# Patient Record
Sex: Female | Born: 1995
Health system: Southern US, Community
[De-identification: ages and names within clinical notes are randomized; demographics above are authoritative.]

## PROBLEM LIST (undated history)

## (undated) ENCOUNTER — Inpatient Hospital Stay (HOSPITAL_COMMUNITY): Payer: Self-pay

## (undated) DIAGNOSIS — Z789 Other specified health status: Secondary | ICD-10-CM

## (undated) DIAGNOSIS — F32A Depression, unspecified: Secondary | ICD-10-CM

## (undated) DIAGNOSIS — F419 Anxiety disorder, unspecified: Secondary | ICD-10-CM

## (undated) DIAGNOSIS — J45909 Unspecified asthma, uncomplicated: Secondary | ICD-10-CM

## (undated) DIAGNOSIS — G43909 Migraine, unspecified, not intractable, without status migrainosus: Secondary | ICD-10-CM

## (undated) HISTORY — DX: Depression, unspecified: F32.A

## (undated) HISTORY — DX: Anxiety disorder, unspecified: F41.9

## (undated) HISTORY — PX: DILATION AND CURETTAGE OF UTERUS: SHX78

## (undated) HISTORY — DX: Migraine, unspecified, not intractable, without status migrainosus: G43.909

## (undated) HISTORY — DX: Other specified health status: Z78.9

## (undated) HISTORY — DX: Unspecified asthma, uncomplicated: J45.909

---
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
## 2013-11-01 ENCOUNTER — Encounter (HOSPITAL_COMMUNITY): Payer: Self-pay | Admitting: Emergency Medicine

## 2013-11-01 ENCOUNTER — Emergency Department (HOSPITAL_COMMUNITY)
Admission: EM | Admit: 2013-11-01 | Discharge: 2013-11-01 | Disposition: A | Discharge status: Home / Self Care | Payer: BC Managed Care – PPO | Attending: Emergency Medicine | Admitting: Emergency Medicine

## 2013-11-01 DIAGNOSIS — M545 Low back pain, unspecified: Secondary | ICD-10-CM | POA: Diagnosis present

## 2013-11-01 DIAGNOSIS — Z79899 Other long term (current) drug therapy: Secondary | ICD-10-CM | POA: Insufficient documentation

## 2013-11-01 DIAGNOSIS — N390 Urinary tract infection, site not specified: Secondary | ICD-10-CM | POA: Diagnosis not present

## 2013-11-01 DIAGNOSIS — Z880 Allergy status to penicillin: Secondary | ICD-10-CM | POA: Diagnosis not present

## 2013-11-01 DIAGNOSIS — IMO0002 Reserved for concepts with insufficient information to code with codable children: Secondary | ICD-10-CM | POA: Diagnosis not present

## 2013-11-01 DIAGNOSIS — Z3202 Encounter for pregnancy test, result negative: Secondary | ICD-10-CM | POA: Insufficient documentation

## 2013-11-01 DIAGNOSIS — R6883 Chills (without fever): Secondary | ICD-10-CM | POA: Insufficient documentation

## 2013-11-01 DIAGNOSIS — Z791 Long term (current) use of non-steroidal anti-inflammatories (NSAID): Secondary | ICD-10-CM | POA: Insufficient documentation

## 2013-11-01 DIAGNOSIS — R110 Nausea: Secondary | ICD-10-CM | POA: Insufficient documentation

## 2013-11-01 DIAGNOSIS — R42 Dizziness and giddiness: Secondary | ICD-10-CM | POA: Diagnosis not present

## 2013-11-01 LAB — URINALYSIS W MICROSCOPIC (NOT AT ARMC)
Bilirubin Urine: NEGATIVE
Glucose, UA: NEGATIVE mg/dL
Nitrite: POSITIVE — AB
PROTEIN: 30 mg/dL — AB
Specific Gravity, Urine: 1.015 (ref 1.005–1.030)
UROBILINOGEN UA: 0.2 mg/dL (ref 0.0–1.0)
pH: 7.5 (ref 5.0–8.0)

## 2013-11-01 LAB — PREGNANCY, URINE: PREG TEST UR: NEGATIVE

## 2013-11-01 LAB — CBG MONITORING, ED: Glucose-Capillary: 94 mg/dL (ref 70–99)

## 2013-11-01 MED ORDER — SULFAMETHOXAZOLE-TRIMETHOPRIM 800-160 MG PO TABS: 1.0000 | ORAL_TABLET | Freq: Two times a day (BID) | ORAL | Status: AC

## 2013-11-01 MED ORDER — ONDANSETRON 8 MG PO TBDP
8.0000 mg | ORAL_TABLET | Freq: Once | ORAL | Status: AC
Start: 2013-11-01 — End: 2013-11-01
  Administered 2013-11-01: 8 mg via ORAL
  Filled 2013-11-01: qty 1

## 2013-11-01 NOTE — ED Provider Notes (Signed)
CSN: [id number]     Arrival date & time 11/01/13  1054 History  This chart was scribed for Joya Gaskinsonald W Candler Ginsberg, MD by Leone PayorSonum Patel, ED Scribe. This patient was seen in room APA09/APA09 and the patient's care was started 11:26 AM.    Chief Complaint  Patient presents with  . Dizziness  . Back Pain    Patient is a 18 y.o. female presenting with back pain. The history is provided by the patient. No language interpreter was used.  Back Pain Location:  Lumbar spine Radiates to:  Does not radiate Pain severity:  Mild Pain is:  Same all the time Duration:  4 hours Timing:  Constant Progression:  Worsening Chronicity:  New Context: not lifting heavy objects, not physical stress and not recent injury   Relieved by:  Nothing Worsened by:  Nothing tried Associated symptoms: dysuria   Associated symptoms: no chest pain, no fever, no numbness and no weakness     HPI Comments: Maria Serrano is a 18 y.o. female who presents to the Emergency Department complaining of constant, gradually worsened lower back pain that began this morning. She reports associated chills and dysuria. She also reports nausea and lightheadedness that began after food consumption this morning. Patient denies recent injury or strain to the back. She also complains of brownish colored urine. She denies chest pain, SOB, vaginal bleeding, vaginal discharge.  No syncope reported   PMH - none History reviewed. No pertinent past surgical history. No family history on file. History  Substance Use Topics  . Smoking status: Never Smoker   . Smokeless tobacco: Not on file  . Alcohol Use: No   OB History   Grav Para Term Preterm Abortions TAB SAB Ect Mult Living                 Review of Systems  Constitutional: Positive for chills. Negative for fever.  Respiratory: Negative for shortness of breath.   Cardiovascular: Negative for chest pain.  Gastrointestinal: Positive for nausea.  Genitourinary: Positive for dysuria.  Negative for vaginal bleeding and vaginal discharge.  Musculoskeletal: Positive for back pain.  Neurological: Positive for light-headedness. Negative for weakness and numbness.      Allergies  Amoxicillin  Home Medications   Prior to Admission medications   Medication Sig Start Date End Date Taking? Authorizing Provider  famotidine (PEPCID) 40 MG tablet Take 0.5 tablets (20 mg total) by mouth 2 (two) times daily. 11/24/12   Burgess AmorJulie Idol, PA-C  naproxen sodium (ANAPROX) 220 MG tablet Take 220-440 mg by mouth as needed. For pain     Historical Provider, MD  predniSONE (DELTASONE) 10 MG tablet 6, 5, 4, 3, 2 then 1 tablet by mouth daily for 6 days total. 11/24/12   Burgess AmorJulie Idol, PA-C   BP 113/77  Pulse 114  Temp(Src) 98.9 F (37.2 C) (Oral)  Resp 18  Ht 5\' 3"  (1.6 m)  Wt 159 lb (72.122 kg)  BMI 28.17 kg/m2  SpO2 100%  LMP 10/19/2013 Physical Exam  Nursing note and vitals reviewed.   CONSTITUTIONAL: Well developed/well nourished HEAD: Normocephalic/atraumatic EYES: EOMI/PERRL ENMT: Mucous membranes moist NECK: supple no meningeal signs SPINE: mild lumbar tenderness. No bruising/crepitance/stepoffs noted to spine  CV: S1/S2 noted, no murmurs/rubs/gallops noted LUNGS: Lungs are clear to auscultation bilaterally, no apparent distress ABDOMEN: soft, nontender, no rebound or guarding GU:no cva tenderness NEURO: Pt is awake/alert, moves all extremitiesx4 EXTREMITIES: pulses normal, full ROM SKIN: warm, color normal PSYCH: no abnormalities of mood noted  ED Course  Procedures   DIAGNOSTIC STUDIES: Oxygen Saturation is 100% on RA, normal by my interpretation.    COORDINATION OF CARE: 11:28 AM Discussed treatment plan with pt at bedside and pt agreed to plan.   Labs Review Labs Reviewed  URINALYSIS W MICROSCOPIC - Abnormal; Notable for the following:    Hgb urine dipstick LARGE (*)    Ketones, ur TRACE (*)    Protein, ur 30 (*)    Nitrite POSITIVE (*)    Leukocytes, UA  MODERATE (*)    Bacteria, UA MANY (*)    All other components within normal limits  PREGNANCY, URINE  CBG MONITORING, ED   Pt stable, not septic appearing feels improved, stable for d/c home  MDM   Final diagnoses:  UTI (lower urinary tract infection)    Nursing notes including past medical history and social history reviewed and considered in documentation Labs/vital reviewed and considered   I personally performed the services described in this documentation, which was scribed in my presence. The recorded information has been reviewed and is accurate.      Joya Gaskins, MD 11/01/13 [phone]

## 2013-11-01 NOTE — ED Notes (Signed)
Pt rpeorts pain in lower back, brownish colored urine, feeling shakey, and dizzy since this morning.

## 2015-03-02 ENCOUNTER — Ambulatory Visit (INDEPENDENT_AMBULATORY_CARE_PROVIDER_SITE_OTHER): Payer: Self-pay | Admitting: Otolaryngology

## 2015-12-21 DIAGNOSIS — B86 Scabies: Secondary | ICD-10-CM | POA: Diagnosis not present

## 2016-05-21 DIAGNOSIS — J020 Streptococcal pharyngitis: Secondary | ICD-10-CM | POA: Diagnosis not present

## 2016-07-23 ENCOUNTER — Encounter: Payer: Self-pay | Admitting: Adult Health

## 2016-07-23 ENCOUNTER — Ambulatory Visit (INDEPENDENT_AMBULATORY_CARE_PROVIDER_SITE_OTHER): Payer: BLUE CROSS/BLUE SHIELD | Admitting: Adult Health

## 2016-07-23 VITALS — BP 120/72 | HR 111 | Ht 63.0 in | Wt 186.0 lb

## 2016-07-23 DIAGNOSIS — O3680X0 Pregnancy with inconclusive fetal viability, not applicable or unspecified: Secondary | ICD-10-CM

## 2016-07-23 DIAGNOSIS — Z3490 Encounter for supervision of normal pregnancy, unspecified, unspecified trimester: Secondary | ICD-10-CM

## 2016-07-23 DIAGNOSIS — N912 Amenorrhea, unspecified: Secondary | ICD-10-CM | POA: Diagnosis not present

## 2016-07-23 DIAGNOSIS — Z3201 Encounter for pregnancy test, result positive: Secondary | ICD-10-CM | POA: Diagnosis not present

## 2016-07-23 LAB — POCT URINE PREGNANCY: PREG TEST UR: POSITIVE — AB

## 2016-07-23 MED ORDER — PRENATAL PLUS 27-1 MG PO TABS: 1.0000 | ORAL_TABLET | Freq: Every day | ORAL | 12 refills | Status: DC

## 2016-07-23 NOTE — Progress Notes (Signed)
Subjective:     Patient ID: Maria Serrano, female   DOB: 06/15/1995, 21 y.o.   MRN: [id number]  HPI Luke is a 21 year old white female, married in for UPT, has missed a period and had +HPT, has some nausea.She had been trying for almost 2 years to get pregnant.   Review of Systems +missed period with +HPT +nausea Reviewed past medical,surgical, social and family history. Reviewed medications and allergies.     Objective:   Physical Exam BP 120/72 (BP Location: Right Arm, Patient Position: Sitting, Cuff Size: Normal)   Pulse (!) 111   Ht  (1.6 m)   Wt 186 lb (84.4 kg)   LMP 04/25/2016 (Approximate)   BMI 32.95 kg/m UPT +, about 12+6 weeks by LMP, with EDD 01/29/17. Skin warm and dry. Neck: mid line trachea, normal thyroid, good ROM, no lymphadenopathy noted. Lungs: clear to ausculation bilaterally. Cardiovascular: regular rate and rhythm.Abdomen is soft and non tender   Verification for Medicaid  given.   Assessment:     1. Positive pregnancy test   2. Pregnancy, unspecified gestational age   88. Encounter to determine fetal viability of pregnancy, single or unspecified fetus       Plan:     Meds ordered this encounter  Medications  . prenatal vitamin w/FE, FA (PRENATAL 1 + 1) 27-1 MG TABS tablet    Sig: Take 1 tablet by mouth daily at 12 noon.    Dispense:  30 each    Refill:  12    Order Specific Question:   Supervising Provider    Answer:   Lazaro Arms [[id number]]  Return in 1 day for dating [place] Review handout on first and second trimester    Eat often

## 2016-07-23 NOTE — Patient Instructions (Signed)
Second Trimester of Pregnancy The second trimester is from week 14 through week 27 (months 4 through 6). The second trimester is often a time when you feel your best. Your body has adjusted to being pregnant, and you begin to feel better physically. Usually, morning sickness has lessened or quit completely, you may have more energy, and you may have an increase in appetite. The second trimester is also a time when the fetus is growing rapidly. At the end of the sixth month, the fetus is about 9 inches long and weighs about 1 pounds. You will likely begin to feel the baby move (quickening) between 16 and 20 weeks of pregnancy. Body changes during your second trimester Your body continues to go through many changes during your second trimester. The changes vary from woman to woman.  Your weight will continue to increase. You will notice your lower abdomen bulging out.  You may begin to get stretch marks on your hips, abdomen, and breasts.  You may develop headaches that can be relieved by medicines. The medicines should be approved by your health care provider.  You may urinate more often because the fetus is pressing on your bladder.  You may develop or continue to have heartburn as a result of your pregnancy.  You may develop constipation because certain hormones are causing the muscles that push waste through your intestines to slow down.  You may develop hemorrhoids or swollen, bulging veins (varicose veins).  You may have back pain. This is caused by:  Weight gain.  Pregnancy hormones that are relaxing the joints in your pelvis.  A shift in weight and the muscles that support your balance.  Your breasts will continue to grow and they will continue to become tender.  Your gums may bleed and may be sensitive to brushing and flossing.  Dark spots or blotches (chloasma, mask of pregnancy) may develop on your face. This will likely fade after the baby is born.  A dark line from your  belly button to the pubic area (linea nigra) may appear. This will likely fade after the baby is born.  You may have changes in your hair. These can include thickening of your hair, rapid growth, and changes in texture. Some women also have hair loss during or after pregnancy, or hair that feels dry or thin. Your hair will most likely return to normal after your baby is born. What to expect at prenatal visits During a routine prenatal visit:  You will be weighed to make sure you and the fetus are growing normally.  Your blood pressure will be taken.  Your abdomen will be measured to track your baby's growth.  The fetal heartbeat will be listened to.  Any test results from the previous visit will be discussed. Your health care provider may ask you:  How you are feeling.  If you are feeling the baby move.  If you have had any abnormal symptoms, such as leaking fluid, bleeding, severe headaches, or abdominal cramping.  If you are using any tobacco products, including cigarettes, chewing tobacco, and electronic cigarettes.  If you have any questions. Other tests that may be performed during your second trimester include:  Blood tests that check for:  Low iron levels (anemia).  High blood sugar that affects pregnant women (gestational diabetes) between 24 and 28 weeks.  Rh antibodies. This is to check for a protein on red blood cells (Rh factor).  Urine tests to check for infections, diabetes, or protein in the   urine.  An ultrasound to confirm the proper growth and development of the baby.  An amniocentesis to check for possible genetic problems.  Fetal screens for spina bifida and Down syndrome.  HIV (human immunodeficiency virus) testing. Routine prenatal testing includes screening for HIV, unless you choose not to have this test. Follow these instructions at home: Medicines   Follow your health care provider's instructions regarding medicine use. Specific medicines may  be either safe or unsafe to take during pregnancy.  Take a prenatal vitamin that contains at least 600 micrograms (mcg) of folic acid.  If you develop constipation, try taking a stool softener if your health care provider approves. Eating and drinking   Eat a balanced diet that includes fresh fruits and vegetables, whole grains, good sources of protein such as meat, eggs, or tofu, and low-fat dairy. Your health care provider will help you determine the amount of weight gain that is right for you.  Avoid raw meat and uncooked cheese. These carry germs that can cause birth defects in the baby.  If you have low calcium intake from food, talk to your health care provider about whether you should take a daily calcium supplement.  Limit foods that are high in fat and processed sugars, such as fried and sweet foods.  To prevent constipation:  Drink enough fluid to keep your urine clear or pale yellow.  Eat foods that are high in fiber, such as fresh fruits and vegetables, whole grains, and beans. Activity   Exercise only as directed by your health care provider. Most women can continue their usual exercise routine during pregnancy. Try to exercise for 30 minutes at least 5 days a week. Stop exercising if you experience uterine contractions.  Avoid heavy lifting, wear low heel shoes, and practice good posture.  A sexual relationship may be continued unless your health care provider directs you otherwise. Relieving pain and discomfort   Wear a good support bra to prevent discomfort from breast tenderness.  Take warm sitz baths to soothe any pain or discomfort caused by hemorrhoids. Use hemorrhoid cream if your health care provider approves.  Rest with your legs elevated if you have leg cramps or low back pain.  If you develop varicose veins, wear support hose. Elevate your feet for 15 minutes, 3-4 times a day. Limit salt in your diet. Prenatal Care   Write down your questions. Take them  to your prenatal visits.  Keep all your prenatal visits as told by your health care provider. This is important. Safety   Wear your seat belt at all times when driving.  Make a list of emergency phone numbers, including numbers for family, friends, the hospital, and police and fire departments. General instructions   Ask your health care provider for a referral to a local prenatal education class. Begin classes no later than the beginning of month 6 of your pregnancy.  Ask for help if you have counseling or nutritional needs during pregnancy. Your health care provider can offer advice or refer you to specialists for help with various needs.  Do not use hot tubs, steam rooms, or saunas.  Do not douche or use tampons or scented sanitary pads.  Do not cross your legs for long periods of time.  Avoid cat litter boxes and soil used by cats. These carry germs that can cause birth defects in the baby and possibly loss of the fetus by miscarriage or stillbirth.  Avoid all smoking, herbs, alcohol, and unprescribed drugs. Chemicals in   these products can affect the formation and growth of the baby.  Do not use any products that contain nicotine or tobacco, such as cigarettes and e-cigarettes. If you need help quitting, ask your health care provider.  Visit your dentist if you have not gone yet during your pregnancy. Use a soft toothbrush to brush your teeth and be gentle when you floss. Contact a health care provider if:  You have dizziness.  You have mild pelvic cramps, pelvic pressure, or nagging pain in the abdominal area.  You have persistent nausea, vomiting, or diarrhea.  You have a bad smelling vaginal discharge.  You have pain when you urinate. Get help right away if:  You have a fever.  You are leaking fluid from your vagina.  You have spotting or bleeding from your vagina.  You have severe abdominal cramping or pain.  You have rapid weight gain or weight loss.  You  have shortness of breath with chest pain.  You notice sudden or extreme swelling of your face, hands, ankles, feet, or legs.  You have not felt your baby move in over an hour.  You have severe headaches that do not go away when you take medicine.  You have vision changes. Summary  The second trimester is from week 14 through week 27 (months 4 through 6). It is also a time when the fetus is growing rapidly.  Your body goes through many changes during pregnancy. The changes vary from woman to woman.  Avoid all smoking, herbs, alcohol, and unprescribed drugs. These chemicals affect the formation and growth your baby.  Do not use any tobacco products, such as cigarettes, chewing tobacco, and e-cigarettes. If you need help quitting, ask your health care provider.  Contact your health care provider if you have any questions. Keep all prenatal visits as told by your health care provider. This is important. This information is not intended to replace advice given to you by your health care provider. Make sure you discuss any questions you have with your health care provider. Document Released: 03/05/2001 Document Revised: 08/17/2015 Document Reviewed: 05/12/2012 Elsevier Interactive Patient Education  2017 [employer]. First Trimester of Pregnancy The first trimester of pregnancy is from week 1 until the end of week 13 (months 1 through 3). A week after a sperm fertilizes an egg, the egg will implant on the wall of the uterus. This embryo will begin to develop into a baby. Genes from you and your partner will form the baby. The female genes will determine whether the baby will be a boy or a girl. At 6-8 weeks, the eyes and face will be formed, and the heartbeat can be seen on ultrasound. At the end of 12 weeks, all the baby's organs will be formed. Now that you are pregnant, you will want to do everything you can to have a healthy baby. Two of the most important things are to get good prenatal care  and to follow your health care provider's instructions. Prenatal care is all the medical care you receive before the baby's birth. This care will help prevent, find, and treat any problems during the pregnancy and childbirth. Body changes during your first trimester Your body goes through many changes during pregnancy. The changes vary from woman to woman.  You may gain or lose a couple of pounds at first.  You may feel sick to your stomach (nauseous) and you may throw up (vomit). If the vomiting is uncontrollable, call your health care provider.  You may tire easily.  You may develop headaches that can be relieved by medicines. All medicines should be approved by your health care provider.  You may urinate more often. Painful urination may mean you have a bladder infection.  You may develop heartburn as a result of your pregnancy.  You may develop constipation because certain hormones are causing the muscles that push stool through your intestines to slow down.  You may develop hemorrhoids or swollen veins (varicose veins).  Your breasts may begin to grow larger and become tender. Your nipples may stick out more, and the tissue that surrounds them (areola) may become darker.  Your gums may bleed and may be sensitive to brushing and flossing.  Dark spots or blotches (chloasma, mask of pregnancy) may develop on your face. This will likely fade after the baby is born.  Your menstrual periods will stop.  You may have a loss of appetite.  You may develop cravings for certain kinds of food.  You may have changes in your emotions from day to day, such as being excited to be pregnant or being concerned that something may go wrong with the pregnancy and baby.  You may have more vivid and strange dreams.  You may have changes in your hair. These can include thickening of your hair, rapid growth, and changes in texture. Some women also have hair loss during or after pregnancy, or hair that  feels dry or thin. Your hair will most likely return to normal after your baby is born. What to expect at prenatal visits During a routine prenatal visit:  You will be weighed to make sure you and the baby are growing normally.  Your blood pressure will be taken.  Your abdomen will be measured to track your baby's growth.  The fetal heartbeat will be listened to between weeks 10 and 14 of your pregnancy.  Test results from any previous visits will be discussed. Your health care provider may ask you:  How you are feeling.  If you are feeling the baby move.  If you have had any abnormal symptoms, such as leaking fluid, bleeding, severe headaches, or abdominal cramping.  If you are using any tobacco products, including cigarettes, chewing tobacco, and electronic cigarettes.  If you have any questions. Other tests that may be performed during your first trimester include:  Blood tests to find your blood type and to check for the presence of any previous infections. The tests will also be used to check for low iron levels (anemia) and protein on red blood cells (Rh antibodies). Depending on your risk factors, or if you previously had diabetes during pregnancy, you may have tests to check for high blood sugar that affects pregnant women (gestational diabetes).  Urine tests to check for infections, diabetes, or protein in the urine.  An ultrasound to confirm the proper growth and development of the baby.  Fetal screens for spinal cord problems (spina bifida) and Down syndrome.  HIV (human immunodeficiency virus) testing. Routine prenatal testing includes screening for HIV, unless you choose not to have this test.  You may need other tests to make sure you and the baby are doing well. Follow these instructions at home: Medicines   Follow your health care provider's instructions regarding medicine use. Specific medicines may be either safe or unsafe to take during pregnancy.  Take a  prenatal vitamin that contains at least 600 micrograms (mcg) of folic acid.  If you develop constipation, try taking a stool softener  if your health care provider approves. Eating and drinking   Eat a balanced diet that includes fresh fruits and vegetables, whole grains, good sources of protein such as meat, eggs, or tofu, and low-fat dairy. Your health care provider will help you determine the amount of weight gain that is right for you.  Avoid raw meat and uncooked cheese. These carry germs that can cause birth defects in the baby.  Eating four or five small meals rather than three large meals a day may help relieve nausea and vomiting. If you start to feel nauseous, eating a few soda crackers can be helpful. Drinking liquids between meals, instead of during meals, also seems to help ease nausea and vomiting.  Limit foods that are high in fat and processed sugars, such as fried and sweet foods.  To prevent constipation:  Eat foods that are high in fiber, such as fresh fruits and vegetables, whole grains, and beans.  Drink enough fluid to keep your urine clear or pale yellow. Activity   Exercise only as directed by your health care provider. Most women can continue their usual exercise routine during pregnancy. Try to exercise for 30 minutes at least 5 days a week. Exercising will help you:  Control your weight.  Stay in shape.  Be prepared for labor and delivery.  Experiencing pain or cramping in the lower abdomen or lower back is a good sign that you should stop exercising. Check with your health care provider before continuing with normal exercises.  Try to avoid standing for long periods of time. Move your legs often if you must stand in one place for a long time.  Avoid heavy lifting.  Wear low-heeled shoes and practice good posture.  You may continue to have sex unless your health care provider tells you not to. Relieving pain and discomfort   Wear a good support bra to  relieve breast tenderness.  Take warm sitz baths to soothe any pain or discomfort caused by hemorrhoids. Use hemorrhoid cream if your health care provider approves.  Rest with your legs elevated if you have leg cramps or low back pain.  If you develop varicose veins in your legs, wear support hose. Elevate your feet for 15 minutes, 3-4 times a day. Limit salt in your diet. Prenatal care   Schedule your prenatal visits by the twelfth week of pregnancy. They are usually scheduled monthly at first, then more often in the last 2 months before delivery.  Write down your questions. Take them to your prenatal visits.  Keep all your prenatal visits as told by your health care provider. This is important. Safety   Wear your seat belt at all times when driving.  Make a list of emergency phone numbers, including numbers for family, friends, the hospital, and police and fire departments. General instructions   Ask your health care provider for a referral to a local prenatal education class. Begin classes no later than the beginning of month 6 of your pregnancy.  Ask for help if you have counseling or nutritional needs during pregnancy. Your health care provider can offer advice or refer you to specialists for help with various needs.  Do not use hot tubs, steam rooms, or saunas.  Do not douche or use tampons or scented sanitary pads.  Do not cross your legs for long periods of time.  Avoid cat litter boxes and soil used by cats. These carry germs that can cause birth defects in the baby and possibly loss of  the fetus by miscarriage or stillbirth.  Avoid all smoking, herbs, alcohol, and medicines not prescribed by your health care provider. Chemicals in these products affect the formation and growth of the baby.  Do not use any products that contain nicotine or tobacco, such as cigarettes and e-cigarettes. If you need help quitting, ask your health care provider. You may receive counseling  support and other resources to help you quit.  Schedule a dentist appointment. At home, brush your teeth with a soft toothbrush and be gentle when you floss. Contact a health care provider if:  You have dizziness.  You have mild pelvic cramps, pelvic pressure, or nagging pain in the abdominal area.  You have persistent nausea, vomiting, or diarrhea.  You have a bad smelling vaginal discharge.  You have pain when you urinate.  You notice increased swelling in your face, hands, legs, or ankles.  You are exposed to fifth disease or chickenpox.  You are exposed to [place] measles (rubella) and have never had it. Get help right away if:  You have a fever.  You are leaking fluid from your vagina.  You have spotting or bleeding from your vagina.  You have severe abdominal cramping or pain.  You have rapid weight gain or loss.  You vomit blood or material that looks like coffee grounds.  You develop a severe headache.  You have shortness of breath.  You have any kind of trauma, such as from a fall or a car accident. Summary  The first trimester of pregnancy is from week 1 until the end of week 13 (months 1 through 3).  Your body goes through many changes during pregnancy. The changes vary from woman to woman.  You will have routine prenatal visits. During those visits, your health care provider will examine you, discuss any test results you may have, and talk with you about how you are feeling. This information is not intended to replace advice given to you by your health care provider. Make sure you discuss any questions you have with your health care provider. Document Released: 03/05/2001 Document Revised: 02/21/2016 Document Reviewed: 02/21/2016 Elsevier Interactive Patient Education  2017 [employer].

## 2016-07-24 ENCOUNTER — Ambulatory Visit (INDEPENDENT_AMBULATORY_CARE_PROVIDER_SITE_OTHER): Payer: BLUE CROSS/BLUE SHIELD

## 2016-07-24 DIAGNOSIS — O3680X0 Pregnancy with inconclusive fetal viability, not applicable or unspecified: Secondary | ICD-10-CM

## 2016-07-24 NOTE — Progress Notes (Signed)
[place] 6+2 wks GS w/ys,no fetal pole seen,subchorionic hemorrhage 1.5 x 1.8 x 1.2 cm,normal ovaries bilat,pt will come back for a f/u ultrasound in 10 days per Victorino Dike

## 2016-08-02 ENCOUNTER — Other Ambulatory Visit: Payer: Self-pay | Admitting: Adult Health

## 2016-08-02 DIAGNOSIS — O3680X0 Pregnancy with inconclusive fetal viability, not applicable or unspecified: Secondary | ICD-10-CM

## 2016-08-05 ENCOUNTER — Ambulatory Visit (INDEPENDENT_AMBULATORY_CARE_PROVIDER_SITE_OTHER): Payer: BLUE CROSS/BLUE SHIELD

## 2016-08-05 DIAGNOSIS — O3680X0 Pregnancy with inconclusive fetal viability, not applicable or unspecified: Secondary | ICD-10-CM

## 2016-08-05 NOTE — Progress Notes (Signed)
US 6+5 wks,single IUP w/ys,pos fht 127 bpm,normal ovaries bilat,crl 8.35 mm,EDD 03/26/2017

## 2016-08-22 ENCOUNTER — Encounter: Payer: BLUE CROSS/BLUE SHIELD | Admitting: Women's Health

## 2016-08-22 ENCOUNTER — Ambulatory Visit: Payer: BLUE CROSS/BLUE SHIELD | Admitting: *Deleted

## 2016-08-22 ENCOUNTER — Ambulatory Visit (INDEPENDENT_AMBULATORY_CARE_PROVIDER_SITE_OTHER): Payer: BLUE CROSS/BLUE SHIELD | Admitting: Women's Health

## 2016-08-22 ENCOUNTER — Encounter: Payer: Self-pay | Admitting: Women's Health

## 2016-08-22 ENCOUNTER — Other Ambulatory Visit (HOSPITAL_COMMUNITY)
Admission: RE | Admit: 2016-08-22 | Discharge: 2016-08-22 | Disposition: A | Discharge status: Home / Self Care | Payer: BLUE CROSS/BLUE SHIELD | Source: Ambulatory Visit | Attending: Obstetrics & Gynecology | Admitting: Obstetrics & Gynecology

## 2016-08-22 VITALS — BP 100/64 | HR 108 | Wt 182.0 lb

## 2016-08-22 DIAGNOSIS — Z3A09 9 weeks gestation of pregnancy: Secondary | ICD-10-CM | POA: Diagnosis not present

## 2016-08-22 DIAGNOSIS — Z1389 Encounter for screening for other disorder: Secondary | ICD-10-CM

## 2016-08-22 DIAGNOSIS — Z3401 Encounter for supervision of normal first pregnancy, first trimester: Secondary | ICD-10-CM | POA: Insufficient documentation

## 2016-08-22 DIAGNOSIS — Z124 Encounter for screening for malignant neoplasm of cervix: Secondary | ICD-10-CM

## 2016-08-22 DIAGNOSIS — Z3400 Encounter for supervision of normal first pregnancy, unspecified trimester: Secondary | ICD-10-CM | POA: Insufficient documentation

## 2016-08-22 DIAGNOSIS — Z331 Pregnant state, incidental: Secondary | ICD-10-CM

## 2016-08-22 LAB — POCT URINALYSIS DIPSTICK
GLUCOSE UA: NEGATIVE
KETONES UA: NEGATIVE
Leukocytes, UA: NEGATIVE
Nitrite, UA: NEGATIVE
Protein, UA: NEGATIVE
RBC UA: NEGATIVE

## 2016-08-22 NOTE — Progress Notes (Signed)
  Subjective:  Maria HickmanBrittany N Shackett is a 21 y.o. G1P0 Caucasian female at 236w1d by 6wk u/s, being seen today for her first obstetrical visit.  Her obstetrical history is significant for primigravida.  Pregnancy history fully reviewed.  Patient reports n/v- declines meds at this time. Denies vb, cramping, uti s/s, abnormal/malodorous vag d/c, or vulvovaginal itching/irritation.  BP 100/64   Pulse (!) 108   Wt 182 lb (82.6 kg)   LMP 04/25/2016 (Approximate)   BMI 32.24 kg/m   HISTORY: OB History  Gravida Para Term Preterm AB Living  1            SAB TAB Ectopic Multiple Live Births               # Outcome Date GA Lbr Len/2nd Weight Sex Delivery Anes PTL Lv  1 Current              Past Medical History:  Diagnosis Date  . Medical history non-contributory    Past Surgical History:  Procedure Laterality Date  . NO PAST SURGERIES     Family History  Problem Relation Age of Onset  . COPD Maternal Grandfather   . Cancer Maternal Grandfather        lung  . Hypertension Father   . Endometriosis Maternal Aunt     Exam   System:     General: Well developed & nourished, no acute distress   Skin: Warm & dry, normal coloration and turgor, no rashes   Neurologic: Alert & oriented, normal mood   Cardiovascular: Regular rate & rhythm   Respiratory: Effort & rate normal, LCTAB, acyanotic   Abdomen: Soft, non tender   Extremities: normal strength, tone   Pelvic Exam:    Perineum: Normal perineum   Vulva: Normal, no lesions   Vagina:  Normal mucosa, normal discharge   Cervix: Normal, bulbous, appears closed   Uterus: Normal size/shape/contour for GA   Thin prep pap smear obtained w/ reflex high risk HPV cotesting FHR: + via informal transabdominal u/s   Assessment:   Pregnancy: G1P0 Patient Active Problem List   Diagnosis Date Noted  . Supervision of normal first pregnancy 08/22/2016    576w1d G1P0 New OB visit N/V of pregnancy  Plan:  Initial labs obtained Continue  prenatal vitamins Problem list reviewed and updated Reviewed n/v relief measures and warning s/s to report Reviewed recommended weight gain based on pre-gravid BMI Encouraged well-balanced diet Genetic Screening discussed Integrated Screen: declined today, wants to discuss w/ partner, will call back if she changes her mind Cystic fibrosis screening discussed declined Ultrasound discussed; fetal survey: requested Follow up in 4 weeks for visit [facility] completed NFPartnership offered, accepted, referral faxed   Marge DuncansBooker, Demba Nigh Randall CNM, [facility] InstituteWHNP-BC 08/22/2016 4:26 PM

## 2016-08-22 NOTE — Patient Instructions (Signed)

## 2016-08-23 ENCOUNTER — Encounter: Payer: Self-pay | Admitting: Women's Health

## 2016-08-24 LAB — URINALYSIS, ROUTINE W REFLEX MICROSCOPIC
BILIRUBIN UA: NEGATIVE
Glucose, UA: NEGATIVE
Ketones, UA: NEGATIVE
LEUKOCYTES UA: NEGATIVE
Nitrite, UA: NEGATIVE
PH UA: 7.5 (ref 5.0–7.5)
PROTEIN UA: NEGATIVE
RBC UA: NEGATIVE
Specific Gravity, UA: <=1.005 — AB (ref 1.005–1.030)
Urobilinogen, Ur: 0.2 mg/dL (ref 0.2–1.0)

## 2016-08-24 LAB — PMP SCREEN PROFILE (10S), URINE
AMPHETAMINE SCREEN URINE: NEGATIVE ng/mL
BARBITURATE SCREEN URINE: NEGATIVE ng/mL
BENZODIAZEPINE SCREEN, URINE: NEGATIVE ng/mL
CANNABINOIDS UR QL SCN: NEGATIVE ng/mL
CREATININE(CRT), U: 18.2 mg/dL — AB (ref 20.0–300.0)
Cocaine (Metab) Scrn, Ur: NEGATIVE ng/mL
METHADONE SCREEN, URINE: NEGATIVE ng/mL
OPIATE SCREEN URINE: NEGATIVE ng/mL
OXYCODONE+OXYMORPHONE UR QL SCN: NEGATIVE ng/mL
PHENCYCLIDINE QUANTITATIVE URINE: NEGATIVE ng/mL
PROPOXYPHENE SCREEN URINE: NEGATIVE ng/mL
Ph of Urine: 7.1 (ref 4.5–8.9)

## 2016-08-24 LAB — URINE CULTURE

## 2016-08-24 LAB — CBC
HEMOGLOBIN: 15.1 g/dL (ref 11.1–15.9)
Hematocrit: 43.2 % (ref 34.0–46.6)
MCH: 33.6 pg — ABNORMAL HIGH (ref 26.6–33.0)
MCHC: 35 g/dL (ref 31.5–35.7)
MCV: 96 fL (ref 79–97)
Platelets: 287 10*3/uL (ref 150–379)
RBC: 4.49 x10E6/uL (ref 3.77–5.28)
RDW: 12 % — ABNORMAL LOW (ref 12.3–15.4)
WBC: 9.6 10*3/uL (ref 3.4–10.8)

## 2016-08-24 LAB — ABO/RH: RH TYPE: POSITIVE

## 2016-08-24 LAB — RUBELLA SCREEN: Rubella Antibodies, IGG: 1.08 index (ref >0.99)

## 2016-08-24 LAB — HEPATITIS B SURFACE ANTIGEN: HEP B S AG: NEGATIVE

## 2016-08-24 LAB — RPR: RPR Ser Ql: NONREACTIVE

## 2016-08-24 LAB — VARICELLA ZOSTER ANTIBODY, IGG: Varicella zoster IgG: 199 index (ref >165)

## 2016-08-24 LAB — ANTIBODY SCREEN: ANTIBODY SCREEN: NEGATIVE

## 2016-08-24 LAB — HIV ANTIBODY (ROUTINE TESTING W REFLEX): HIV SCREEN 4TH GENERATION: NONREACTIVE

## 2016-08-24 LAB — SPECIFIC GRAVITY: SPECIFIC GRAVITY: 1.0028

## 2016-08-29 LAB — CYTOLOGY - PAP
Chlamydia: NEGATIVE
DIAGNOSIS: NEGATIVE
NEISSERIA GONORRHEA: NEGATIVE

## 2016-09-18 ENCOUNTER — Other Ambulatory Visit: Payer: Self-pay | Admitting: Women's Health

## 2016-09-18 DIAGNOSIS — Z3682 Encounter for antenatal screening for nuchal translucency: Secondary | ICD-10-CM

## 2016-09-19 ENCOUNTER — Ambulatory Visit (INDEPENDENT_AMBULATORY_CARE_PROVIDER_SITE_OTHER): Payer: BLUE CROSS/BLUE SHIELD | Admitting: Obstetrics & Gynecology

## 2016-09-19 ENCOUNTER — Ambulatory Visit (INDEPENDENT_AMBULATORY_CARE_PROVIDER_SITE_OTHER): Payer: BLUE CROSS/BLUE SHIELD

## 2016-09-19 ENCOUNTER — Encounter: Payer: Self-pay | Admitting: Obstetrics & Gynecology

## 2016-09-19 VITALS — BP 108/62 | HR 72 | Wt 182.0 lb

## 2016-09-19 DIAGNOSIS — Z3682 Encounter for antenatal screening for nuchal translucency: Secondary | ICD-10-CM | POA: Diagnosis not present

## 2016-09-19 DIAGNOSIS — Z1379 Encounter for other screening for genetic and chromosomal anomalies: Secondary | ICD-10-CM

## 2016-09-19 DIAGNOSIS — Z3401 Encounter for supervision of normal first pregnancy, first trimester: Secondary | ICD-10-CM

## 2016-09-19 DIAGNOSIS — Z3402 Encounter for supervision of normal first pregnancy, second trimester: Secondary | ICD-10-CM

## 2016-09-19 DIAGNOSIS — Z1389 Encounter for screening for other disorder: Secondary | ICD-10-CM

## 2016-09-19 DIAGNOSIS — Z331 Pregnant state, incidental: Secondary | ICD-10-CM

## 2016-09-19 LAB — POCT URINALYSIS DIPSTICK
Glucose, UA: NEGATIVE
Ketones, UA: NEGATIVE
Leukocytes, UA: NEGATIVE
Nitrite, UA: NEGATIVE
PROTEIN UA: NEGATIVE
RBC UA: NEGATIVE

## 2016-09-19 NOTE — Progress Notes (Signed)
G1P0 9463w1d Estimated Date of Delivery: 03/26/17  Blood pressure 108/62, pulse 72, weight 182 lb (82.6 kg), last menstrual period 04/25/2016.   BP weight and urine results all reviewed and noted.  Please refer to the obstetrical flow sheet for the fundal height and fetal heart rate documentation:  Patient reports good fetal movement, denies any bleeding and no rupture of membranes symptoms or regular contractions. Patient is without complaints. All questions were answered.  Orders Placed This Encounter  Procedures  . Maternal Screen, Integrated #1  . POCT urinalysis dipstick    Plan:  Continued routine obstetrical care, NT sonogram is normal  Return in about 4 weeks (around 10/17/2016) for 2nd IT, LROB.

## 2016-09-19 NOTE — Progress Notes (Signed)
US 13+1 wks,NB present,NT 1.4 mm,normal ovaries bilat,crl 73.1 mm,post pl gr 0

## 2016-09-22 LAB — MATERNAL SCREEN, INTEGRATED #1
Crown Rump Length: 73.1 mm
Gest. Age on Collection Date: 13.1 weeks
Maternal Age at EDD: 23.6 yr
Nuchal Translucency (NT): 1.4 mm
Number of Fetuses: 1
PAPP-A Value: 274 ng/mL
Weight: 182 [lb_av]

## 2016-10-17 ENCOUNTER — Ambulatory Visit (INDEPENDENT_AMBULATORY_CARE_PROVIDER_SITE_OTHER): Payer: BLUE CROSS/BLUE SHIELD | Admitting: Advanced Practice Midwife

## 2016-10-17 ENCOUNTER — Encounter: Payer: Self-pay | Admitting: Advanced Practice Midwife

## 2016-10-17 VITALS — BP 104/62 | HR 96 | Wt 182.0 lb

## 2016-10-17 DIAGNOSIS — Z3A17 17 weeks gestation of pregnancy: Secondary | ICD-10-CM

## 2016-10-17 DIAGNOSIS — Z331 Pregnant state, incidental: Secondary | ICD-10-CM

## 2016-10-17 DIAGNOSIS — Z1379 Encounter for other screening for genetic and chromosomal anomalies: Secondary | ICD-10-CM | POA: Diagnosis not present

## 2016-10-17 DIAGNOSIS — Z1389 Encounter for screening for other disorder: Secondary | ICD-10-CM

## 2016-10-17 DIAGNOSIS — Z3402 Encounter for supervision of normal first pregnancy, second trimester: Secondary | ICD-10-CM

## 2016-10-17 DIAGNOSIS — Z363 Encounter for antenatal screening for malformations: Secondary | ICD-10-CM

## 2016-10-17 LAB — POCT URINALYSIS DIPSTICK
Glucose, UA: NEGATIVE
KETONES UA: NEGATIVE
Leukocytes, UA: NEGATIVE
Nitrite, UA: NEGATIVE
PROTEIN UA: NEGATIVE
RBC UA: NEGATIVE

## 2016-10-17 NOTE — Progress Notes (Signed)
G1P0 6940w1d Estimated Date of Delivery: 03/26/17  Last menstrual period 04/25/2016.   BP weight and urine results all reviewed and noted.  Please refer to the obstetrical flow sheet for the fundal height and fetal heart rate documentation:  Patient denies any bleeding and no rupture of membranes symptoms or regular contractions. Patient is without complaints. Is eating more healthy All questions were answered.  No orders of the defined types were placed in this encounter.   Plan:  Continued routine obstetrical care, 2ND it TODAY   Return in about 2 weeks (around 10/31/2016) for LROB, ZO:[id number]:Anatomy. `

## 2016-10-22 ENCOUNTER — Encounter: Payer: Self-pay | Admitting: Advanced Practice Midwife

## 2016-10-22 ENCOUNTER — Other Ambulatory Visit: Payer: BLUE CROSS/BLUE SHIELD

## 2016-10-22 ENCOUNTER — Other Ambulatory Visit: Payer: Self-pay | Admitting: Advanced Practice Midwife

## 2016-10-22 DIAGNOSIS — O289 Unspecified abnormal findings on antenatal screening of mother: Secondary | ICD-10-CM

## 2016-10-22 DIAGNOSIS — Z363 Encounter for antenatal screening for malformations: Secondary | ICD-10-CM

## 2016-10-22 DIAGNOSIS — O285 Abnormal chromosomal and genetic finding on antenatal screening of mother: Secondary | ICD-10-CM

## 2016-10-22 DIAGNOSIS — O351XX0 Maternal care for (suspected) chromosomal abnormality in fetus, not applicable or unspecified: Secondary | ICD-10-CM | POA: Diagnosis not present

## 2016-10-22 LAB — MATERNAL SCREEN, INTEGRATED #2
ADSF: 0.48
AFP MOM: 0.86
Alpha-Fetoprotein: 27 ng/mL
Crown Rump Length: 73.1 mm
DIA MoM: 0.85
DIA Value: 125.2 pg/mL
Estriol, Unconjugated: 0.45 ng/mL
GEST. AGE ON COLLECTION DATE: 13.1 wk
GESTATIONAL AGE: 17.1 wk
Maternal Age at EDD: 23.6 yr
NUMBER OF FETUSES: 1
Nuchal Translucency (NT): 1.4 mm
Nuchal Translucency MoM: 0.78
PAPP-A MoM: 0.29
PAPP-A VALUE: 274 ng/mL
TEST RESULTS: POSITIVE — AB
WEIGHT: 182 [lb_av]
Weight: 182 [lb_av]
hCG MoM: 0.33
hCG Value: 7.9 IU/mL

## 2016-10-22 NOTE — Progress Notes (Signed)
Elevated T18 risk.  MFM referral for 8/2 at 12;45  for genetic counseling and US.  cfDNA offered and accepted.  Will come today for informasique.  (need to have accurate weight when ordering).

## 2016-10-24 ENCOUNTER — Ambulatory Visit (HOSPITAL_COMMUNITY)
Admission: RE | Admit: 2016-10-24 | Discharge: 2016-10-24 | Disposition: A | Discharge status: Home / Self Care | Payer: BLUE CROSS/BLUE SHIELD | Source: Ambulatory Visit | Attending: Advanced Practice Midwife | Admitting: Advanced Practice Midwife

## 2016-10-24 ENCOUNTER — Encounter (HOSPITAL_COMMUNITY): Payer: Self-pay

## 2016-10-24 ENCOUNTER — Telehealth (HOSPITAL_COMMUNITY): Payer: Self-pay | Admitting: MS"

## 2016-10-24 DIAGNOSIS — O280 Abnormal hematological finding on antenatal screening of mother: Secondary | ICD-10-CM | POA: Diagnosis not present

## 2016-10-24 DIAGNOSIS — O99212 Obesity complicating pregnancy, second trimester: Secondary | ICD-10-CM | POA: Insufficient documentation

## 2016-10-24 DIAGNOSIS — Z363 Encounter for antenatal screening for malformations: Secondary | ICD-10-CM | POA: Diagnosis not present

## 2016-10-24 DIAGNOSIS — Z3A18 18 weeks gestation of pregnancy: Secondary | ICD-10-CM | POA: Insufficient documentation

## 2016-10-24 DIAGNOSIS — O283 Abnormal ultrasonic finding on antenatal screening of mother: Secondary | ICD-10-CM | POA: Diagnosis not present

## 2016-10-24 DIAGNOSIS — Z6831 Body mass index (BMI) 31.0-31.9, adult: Secondary | ICD-10-CM | POA: Diagnosis not present

## 2016-10-24 DIAGNOSIS — O281 Abnormal biochemical finding on antenatal screening of mother: Secondary | ICD-10-CM | POA: Diagnosis not present

## 2016-10-24 DIAGNOSIS — E669 Obesity, unspecified: Secondary | ICD-10-CM | POA: Insufficient documentation

## 2016-10-24 DIAGNOSIS — Z3402 Encounter for supervision of normal first pregnancy, second trimester: Secondary | ICD-10-CM

## 2016-10-24 NOTE — Telephone Encounter (Signed)
Left message for patient. Pregnancy options consultation scheduled with Dr. Bobbe MedicoApril Miller at [facility] 10/30/16 at 10:00 AM at [address] [place]. Left phone number for patient to contact with questions.   Clydie BraunKaren Briony Parveen 10/24/2016 3:47 PM

## 2016-10-24 NOTE — Progress Notes (Signed)
Genetic Counseling  High-Risk Gestation Note  Appointment Date:  10/24/2016 Referred By: Wandra Mannanresenzo-Dishmon, Franc* Date of Birth:  12-23-1995 Partner:  Gwynneth AlbrightAnthony Fehl   Pregnancy History: G1P0 Estimated Date of Delivery: 03/26/17 Estimated Gestational Age: 779w1d Attending: Alpha GulaPaul Whitecar, MD   Mrs. Maria HickmanBrittany N Bissonnette and her husband, Mr. Gwynneth AlbrightAnthony Sohail, were seen for genetic counseling because of an increased risk for fetal trisomy 18 based on Integrated screening through [facility].  In summary:  Reviewed results of screening test  Increased risk for Trisomy 18 (1 in 10)  Ultrasound performed today: multiple abnormalities visualized  Fetal heart defect, abnormal hand positioning, club foot vs rocker bottom foot  Reviewed that ultrasound findings combined with Integrated screening result are strongly suggestive of Trisomy 18  Discussed additional screening options  NIPS- [facility], currently pending  Ultrasound- follow-up scheduled  Fetal echocardiogram- scheduled  Discussed diagnostic testing options  Amniocentesis- declined today  Discussed continuing versus termination of pregnancy  Pregnancy options consultation scheduled with [facility] 10/30/16  Reviewed family history concerns  They were counseled regarding the screening result and the associated 1 in 10 risk for fetal trisomy 18.  We reviewed chromosomes, nondisjunction, and the common features and poor prognosis of trisomy 2118.  In addition, we reviewed the screen adjusted reduction in risks for trisomy 21 and open neural tube defects.  We also discussed other explanations for a screen positive result including: a gestational dating error, differences in maternal metabolism, and normal variation.  Detailed ultrasound was performed today, and multiple anomalies were visualized. A large AV canal defect was visualized. Abnormal fetal hand posturing and bilateral club foot versus rocker  bottom feet were also visualized. Complete ultrasound results reported under separate cover.   We reviewed other available screening options including noninvasive prenatal screening (NIPS)/cell free DNA (cfDNA) screening and detailed ultrasound.  They were counseled that screening tests are used to modify a patient's a priori risk for aneuploidy, typically based on age. This estimate provides a pregnancy specific risk assessment. We reviewed the benefits and limitations of each option. Specifically, we discussed the conditions for which each test screens, the detection rates, and false positive rates of each. NIPS (InformaSeq) was drawn at her OB provider's office on 10/22/16. Results are currently pending. They understand that NIPS is highly sensitive and specific but is not diagnostic.   They were also counseled regarding diagnostic testing via amniocentesis. We reviewed the approximate 1 in 300-500 risk for complications from amniocentesis, including spontaneous pregnancy loss. We also discussed that karyotype analysis is available postnatally (after delivery or on products of conception). We discussed the possible results that the tests might provide including: positive, negative, unanticipated, and no result. Finally, they were counseled regarding the cost of each option and potential out of pocket expenses. After consideration of all the options, they declined amniocentesis today.   We reviewed that the constellation of ultrasound findings in combination with the abnormal integrated screening results are highly suspicious for an underlying chromosome conditions, particularly trisomy 1818. We spent time reviewing the variable features and poor prognosis of trisomy 18. We discussed options of continuing the pregnancy with increased ultrasound surveillance including fetal echocardiogram versus termination of pregnancy. They understand that the legal limit for [place] in [place] is [redacted] weeks gestation. The  couple was understandably upset by the information at today's visit and indicated that they planned to further consider options for the pregnancy. We discussed available patient friendly resources including trisomy 18 foundation  and the website "a heartbreaking choice."  They are undecided how they would like to proceed but elected to schedule a pregnancy options consultation with Dr. Bobbe MedicoApril Miller, [facility] to obtain more information about termination of pregnancy. This appointment has been scheduled for Wednesday 10/30/16 at 10:00 am [place]. Follow-up ultrasound appointment was scheduled in 4 weeks, and fetal echocardiogram will be scheduled. These appointments can be cancelled, if the couple elects to proceed with termination of pregnancy.   Mrs. Corine ShelterWatkins was provided with written information regarding cystic fibrosis (CF), spinal muscular atrophy (SMA) and hemoglobinopathies including the carrier frequency, availability of carrier screening and prenatal diagnosis if indicated.  In addition, we discussed that CF and hemoglobinopathies are routinely screened for as part of the [place] newborn screening panel.  This was not discussed today given the nature of today's visit. Carrier screening is available to the patient, if desired in the future.    Both family histories were briefly reviewed and found to be noncontributory for birth defects, intellectual disability, recurrent pregnancy loss, and known genetic conditions. Consanguinity was denied. Detailed pedigree not constructed today due to the nature of today's visit. Without further information regarding the provided family history, an accurate genetic risk cannot be calculated. Further genetic counseling is warranted if more information is obtained.  Mrs. Maria HickmanBrittany N Serrano denied exposure to environmental toxins or chemical agents. She denied the use of alcohol, tobacco or street drugs. She denied significant viral illnesses  during the course of her pregnancy. Her medical and surgical histories were noncontributory.   I counseled this couple for approximately 35 minutes regarding the above risks and available options.   Quinn PlowmanKaren Raylinn Kosar, MS,  Certified Genetic Counselor 10/24/2016

## 2016-10-25 ENCOUNTER — Other Ambulatory Visit (HOSPITAL_COMMUNITY): Payer: Self-pay | Admitting: *Deleted

## 2016-10-25 DIAGNOSIS — O289 Unspecified abnormal findings on antenatal screening of mother: Secondary | ICD-10-CM

## 2016-10-27 LAB — INFORMASEQ(SM) WITH XY ANALYSIS
Chromosome 18 Result: DETECTED — AB
FETAL FRACTION (%): 10.2
FETAL NUMBER: 1
GESTATIONAL AGE AT COLLECTION: 17.6 wk
Screen Result: DETECTED — AB
Weight: 183 [lb_av]

## 2016-10-29 ENCOUNTER — Telehealth (HOSPITAL_COMMUNITY): Payer: Self-pay | Admitting: MS"

## 2016-10-29 NOTE — Telephone Encounter (Signed)
Called patient to check in with her. Patient stated that she is doing OK. She is aware of appointment tomorrow with Dr. Hyacinth MeekerMiller to review pregnancy options. Also discussed that it appears that her NIPS (InformaSeq) results are now available and asked if patient was OK with me reviewing these results with her. Patient stated that she was. Discussed that NIPS is positive for Trisomy 18. Reviewed that while not diagnostic, this screen has a high positive predictive value, particularly given the integrated screening and ultrasound results. Reviewed that amniocentesis or postnatal chromosome analysis available for confirmation. Patient stated that they do not want to pursue amniocentesis. Encouraged her to contact us to assist in providing additional support resources to her.   Clydie BraunKaren Sanyiah Kanzler 10/29/2016 11:59 AM

## 2016-10-30 DIAGNOSIS — Z3A19 19 weeks gestation of pregnancy: Secondary | ICD-10-CM | POA: Diagnosis not present

## 2016-10-30 DIAGNOSIS — O351XX0 Maternal care for (suspected) chromosomal abnormality in fetus, not applicable or unspecified: Secondary | ICD-10-CM | POA: Diagnosis not present

## 2016-10-30 DIAGNOSIS — O358XX0 Maternal care for other (suspected) fetal abnormality and damage, not applicable or unspecified: Secondary | ICD-10-CM | POA: Diagnosis not present

## 2016-11-01 ENCOUNTER — Ambulatory Visit (INDEPENDENT_AMBULATORY_CARE_PROVIDER_SITE_OTHER): Payer: BLUE CROSS/BLUE SHIELD | Admitting: Obstetrics & Gynecology

## 2016-11-01 ENCOUNTER — Encounter: Payer: Self-pay | Admitting: Obstetrics & Gynecology

## 2016-11-01 ENCOUNTER — Other Ambulatory Visit: Payer: BLUE CROSS/BLUE SHIELD

## 2016-11-01 ENCOUNTER — Encounter: Payer: BLUE CROSS/BLUE SHIELD | Admitting: Obstetrics & Gynecology

## 2016-11-01 VITALS — BP 118/70 | HR 86 | Wt 180.0 lb

## 2016-11-01 DIAGNOSIS — Z3A19 19 weeks gestation of pregnancy: Secondary | ICD-10-CM | POA: Diagnosis not present

## 2016-11-01 DIAGNOSIS — Q913 Trisomy 18, unspecified: Secondary | ICD-10-CM

## 2016-11-01 DIAGNOSIS — O351XX0 Maternal care for (suspected) chromosomal abnormality in fetus, not applicable or unspecified: Secondary | ICD-10-CM

## 2016-11-01 DIAGNOSIS — Z1389 Encounter for screening for other disorder: Secondary | ICD-10-CM

## 2016-11-01 DIAGNOSIS — Z331 Pregnant state, incidental: Secondary | ICD-10-CM

## 2016-11-01 LAB — POCT URINALYSIS DIPSTICK
Glucose, UA: NEGATIVE
Leukocytes, UA: NEGATIVE
NITRITE UA: NEGATIVE
Protein, UA: NEGATIVE
RBC UA: NEGATIVE

## 2016-11-01 NOTE — Progress Notes (Signed)
Chief Complaint  Patient presents with  . Routine Prenatal Visit    pt terminating pregnancy on Tuesday   Blood pressure 118/70, pulse 86, weight 180 lb (81.6 kg), last menstrual period 04/25/2016.    Pt [facility] of this Trisomy 18 pregnancy  Will follow up with patient in 2 weeks to see how she is doing emotionally If she needs to be seen prior she is encouraged to do so     Face to face time:  10 minutes  Greater than 50% of the visit time was spent in counseling and coordination of care with the patient.  The summary and outline of the counseling and care coordination is summarized in the note above.   All questions were answered.

## 2016-11-04 DIAGNOSIS — O358XX0 Maternal care for other (suspected) fetal abnormality and damage, not applicable or unspecified: Secondary | ICD-10-CM | POA: Diagnosis not present

## 2016-11-04 DIAGNOSIS — Z3A00 Weeks of gestation of pregnancy not specified: Secondary | ICD-10-CM | POA: Diagnosis not present

## 2016-11-04 DIAGNOSIS — O351XX0 Maternal care for (suspected) chromosomal abnormality in fetus, not applicable or unspecified: Secondary | ICD-10-CM | POA: Diagnosis not present

## 2016-11-05 DIAGNOSIS — R52 Pain, unspecified: Secondary | ICD-10-CM | POA: Diagnosis not present

## 2016-11-05 DIAGNOSIS — M5489 Other dorsalgia: Secondary | ICD-10-CM | POA: Diagnosis not present

## 2016-11-05 DIAGNOSIS — O034 Incomplete spontaneous abortion without complication: Secondary | ICD-10-CM | POA: Diagnosis not present

## 2016-11-05 DIAGNOSIS — Q913 Trisomy 18, unspecified: Secondary | ICD-10-CM

## 2016-11-05 DIAGNOSIS — Z3A19 19 weeks gestation of pregnancy: Secondary | ICD-10-CM | POA: Diagnosis not present

## 2016-11-05 DIAGNOSIS — O021 Missed abortion: Secondary | ICD-10-CM | POA: Diagnosis not present

## 2016-11-15 ENCOUNTER — Encounter: Payer: Self-pay | Admitting: Obstetrics and Gynecology

## 2016-11-15 ENCOUNTER — Ambulatory Visit (INDEPENDENT_AMBULATORY_CARE_PROVIDER_SITE_OTHER): Payer: BLUE CROSS/BLUE SHIELD | Admitting: Obstetrics and Gynecology

## 2016-11-15 DIAGNOSIS — Z9889 Other specified postprocedural states: Secondary | ICD-10-CM

## 2016-11-15 NOTE — Patient Instructions (Addendum)
[email or web address] [phone] [email or web address]  [phone]

## 2016-11-15 NOTE — Progress Notes (Signed)
   [facility] Visit  11/15/2016            Patient name: Maria Serrano MRN [id number]  Date of birth: 12/06/1995  CC & HPI:  Maria Serrano is a 21 y.o. female presenting today for f/u after missed abortion with fetal demise before 20 weeks of gestation on 11/05/16. She was scheduled for D&E at [facility] for lethal anomaly and laminaria placed then resulted in prolapse of the cord the day before the procedure and was observed overnight until the procedure could be performed the following morning Pt had dilation and evacuation at [facility]. Fetus had multiple anomalies and NIPS positive for T18. Pt states she has slight cramping, slight bleeding with no clots. She wants to try again for another pregnancy. She had a genetic panel test done. She is still attempting to return back to normal, and says she mostly keeps feelings to herself. She does have a close friend and her mother for support.  ROS:  ROS -fever -pain +slight vaginal bleeding +cramping  Pertinent History Reviewed:   Reviewed: Significant for D&C of uterus Medical         Past Medical History:  Diagnosis Date  . Medical history non-contributory                               Surgical Hx:    Past Surgical History:  Procedure Laterality Date  . DILATION AND CURETTAGE OF UTERUS     Medications: Reviewed & Updated - see associated section                       Current Outpatient Prescriptions:  .  Prenatal MV-Min-FA-Omega-3 (PRENATAL GUMMIES/DHA & FA PO), Take by mouth. Takes 2 daily, Disp: , Rfl:    Social History: Reviewed -  reports that she has quit smoking. She quit after 0.50 years of use. She has never used smokeless tobacco.  Objective Findings:  Vitals: Blood pressure 100/66, pulse 77, weight 175 lb 9.6 oz (79.7 kg), last menstrual period 04/25/2016.  Physical Examination: General appearance - alert, well appearing, and in no distress Mental status - alert, oriented to person,  place, and time Pelvic - not indicated   Discussion PP discussion about handling mental health, trying again for another baby, and birth control. Pt asked about her support circle. Advised about [facility] [employer] regarding heart strings support group at [facility] with its website information given to patient   Specific discussion as noted above. Greater than 50% was spent in counseling and coordination of care with the patient.   Total time greater than: 25 minutes.     Assessment & Plan:   A:  1. Appropriate PP grief reaction/PP depression  P:  1. F/u PRN, pt advised to continue prenatal vitamins until she conceives, Genetic testing for future pregnancy encouraged     By signing my name below, I, Izna Ahmed, attest that this documentation has been prepared under the direction and in the presence of Tilda Burrow., MD. Electronically Signed: Redge Gainer, Medical Scribe. 11/15/16. 11:12 AM.  I personally performed the services described in this documentation, which was SCRIBED in my presence. The recorded information has been reviewed and considered accurate. It has been edited as necessary during review. Tilda Burrow, MD

## 2016-11-21 ENCOUNTER — Encounter (HOSPITAL_COMMUNITY): Payer: Self-pay

## 2016-11-21 ENCOUNTER — Ambulatory Visit (HOSPITAL_COMMUNITY): Payer: BLUE CROSS/BLUE SHIELD

## 2017-01-29 ENCOUNTER — Encounter: Payer: Self-pay | Admitting: *Deleted

## 2017-01-29 ENCOUNTER — Ambulatory Visit (INDEPENDENT_AMBULATORY_CARE_PROVIDER_SITE_OTHER): Payer: BLUE CROSS/BLUE SHIELD | Admitting: Adult Health

## 2017-01-29 VITALS — BP 118/62 | HR 80 | Resp 18 | Ht 63.0 in | Wt 177.5 lb

## 2017-01-29 DIAGNOSIS — N926 Irregular menstruation, unspecified: Secondary | ICD-10-CM

## 2017-01-29 DIAGNOSIS — Z3201 Encounter for pregnancy test, result positive: Secondary | ICD-10-CM | POA: Diagnosis not present

## 2017-01-29 DIAGNOSIS — Z8481 Family history of carrier of genetic disease: Secondary | ICD-10-CM | POA: Diagnosis not present

## 2017-01-29 DIAGNOSIS — Z8279 Family history of other congenital malformations, deformations and chromosomal abnormalities: Secondary | ICD-10-CM

## 2017-01-29 DIAGNOSIS — O3680X0 Pregnancy with inconclusive fetal viability, not applicable or unspecified: Secondary | ICD-10-CM

## 2017-01-29 DIAGNOSIS — Z3A01 Less than 8 weeks gestation of pregnancy: Secondary | ICD-10-CM

## 2017-01-29 LAB — POCT URINE PREGNANCY: Preg Test, Ur: POSITIVE — AB

## 2017-01-29 NOTE — Patient Instructions (Signed)
First Trimester of Pregnancy The first trimester of pregnancy is from week 1 until the end of week 13 (months 1 through 3). A week after a sperm fertilizes an egg, the egg will implant on the wall of the uterus. This embryo will begin to develop into a baby. Genes from you and your partner will form the baby. The female genes will determine whether the baby will be a boy or a girl. At 6-8 weeks, the eyes and face will be formed, and the heartbeat can be seen on ultrasound. At the end of 12 weeks, all the baby's organs will be formed. Now that you are pregnant, you will want to do everything you can to have a healthy baby. Two of the most important things are to get good prenatal care and to follow your health care provider's instructions. Prenatal care is all the medical care you receive before the baby's birth. This care will help prevent, find, and treat any problems during the pregnancy and childbirth. Body changes during your first trimester Your body goes through many changes during pregnancy. The changes vary from woman to woman.  You may gain or lose a couple of pounds at first.  You may feel sick to your stomach (nauseous) and you may throw up (vomit). If the vomiting is uncontrollable, call your health care provider.  You may tire easily.  You may develop headaches that can be relieved by medicines. All medicines should be approved by your health care provider.  You may urinate more often. Painful urination may mean you have a bladder infection.  You may develop heartburn as a result of your pregnancy.  You may develop constipation because certain hormones are causing the muscles that push stool through your intestines to slow down.  You may develop hemorrhoids or swollen veins (varicose veins).  Your breasts may begin to grow larger and become tender. Your nipples may stick out more, and the tissue that surrounds them (areola) may become darker.  Your gums may bleed and may be  sensitive to brushing and flossing.  Dark spots or blotches (chloasma, mask of pregnancy) may develop on your face. This will likely fade after the baby is born.  Your menstrual periods will stop.  You may have a loss of appetite.  You may develop cravings for certain kinds of food.  You may have changes in your emotions from day to day, such as being excited to be pregnant or being concerned that something may go wrong with the pregnancy and baby.  You may have more vivid and strange dreams.  You may have changes in your hair. These can include thickening of your hair, rapid growth, and changes in texture. Some women also have hair loss during or after pregnancy, or hair that feels dry or thin. Your hair will most likely return to normal after your baby is born.  What to expect at prenatal visits During a routine prenatal visit:  You will be weighed to make sure you and the baby are growing normally.  Your blood pressure will be taken.  Your abdomen will be measured to track your baby's growth.  The fetal heartbeat will be listened to between weeks 10 and 14 of your pregnancy.  Test results from any previous visits will be discussed.  Your health care provider may ask you:  How you are feeling.  If you are feeling the baby move.  If you have had any abnormal symptoms, such as leaking fluid, bleeding, severe headaches,   or abdominal cramping.  If you are using any tobacco products, including cigarettes, chewing tobacco, and electronic cigarettes.  If you have any questions.  Other tests that may be performed during your first trimester include:  Blood tests to find your blood type and to check for the presence of any previous infections. The tests will also be used to check for low iron levels (anemia) and protein on red blood cells (Rh antibodies). Depending on your risk factors, or if you previously had diabetes during pregnancy, you may have tests to check for high blood  sugar that affects pregnant women (gestational diabetes).  Urine tests to check for infections, diabetes, or protein in the urine.  An ultrasound to confirm the proper growth and development of the baby.  Fetal screens for spinal cord problems (spina bifida) and Down syndrome.  HIV (human immunodeficiency virus) testing. Routine prenatal testing includes screening for HIV, unless you choose not to have this test.  You may need other tests to make sure you and the baby are doing well.  Follow these instructions at home: Medicines  Follow your health care provider's instructions regarding medicine use. Specific medicines may be either safe or unsafe to take during pregnancy.  Take a prenatal vitamin that contains at least 600 micrograms (mcg) of folic acid.  If you develop constipation, try taking a stool softener if your health care provider approves. Eating and drinking  Eat a balanced diet that includes fresh fruits and vegetables, whole grains, good sources of protein such as meat, eggs, or tofu, and low-fat dairy. Your health care provider will help you determine the amount of weight gain that is right for you.  Avoid raw meat and uncooked cheese. These carry germs that can cause birth defects in the baby.  Eating four or five small meals rather than three large meals a day may help relieve nausea and vomiting. If you start to feel nauseous, eating a few soda crackers can be helpful. Drinking liquids between meals, instead of during meals, also seems to help ease nausea and vomiting.  Limit foods that are high in fat and processed sugars, such as fried and sweet foods.  To prevent constipation: ? Eat foods that are high in fiber, such as fresh fruits and vegetables, whole grains, and beans. ? Drink enough fluid to keep your urine clear or pale yellow. Activity  Exercise only as directed by your health care provider. Most women can continue their usual exercise routine during  pregnancy. Try to exercise for 30 minutes at least 5 days a week. Exercising will help you: ? Control your weight. ? Stay in shape. ? Be prepared for labor and delivery.  Experiencing pain or cramping in the lower abdomen or lower back is a good sign that you should stop exercising. Check with your health care provider before continuing with normal exercises.  Try to avoid standing for long periods of time. Move your legs often if you must stand in one place for a long time.  Avoid heavy lifting.  Wear low-heeled shoes and practice good posture.  You may continue to have sex unless your health care provider tells you not to. Relieving pain and discomfort  Wear a good support bra to relieve breast tenderness.  Take warm sitz baths to soothe any pain or discomfort caused by hemorrhoids. Use hemorrhoid cream if your health care provider approves.  Rest with your legs elevated if you have leg cramps or low back pain.  If you develop   varicose veins in your legs, wear support hose. Elevate your feet for 15 minutes, 3-4 times a day. Limit salt in your diet. Prenatal care  Schedule your prenatal visits by the twelfth week of pregnancy. They are usually scheduled monthly at first, then more often in the last 2 months before delivery.  Write down your questions. Take them to your prenatal visits.  Keep all your prenatal visits as told by your health care provider. This is important. Safety  Wear your seat belt at all times when driving.  Make a list of emergency phone numbers, including numbers for family, friends, the hospital, and police and fire departments. General instructions  Ask your health care provider for a referral to a local prenatal education class. Begin classes no later than the beginning of month 6 of your pregnancy.  Ask for help if you have counseling or nutritional needs during pregnancy. Your health care provider can offer advice or refer you to specialists for help  with various needs.  Do not use hot tubs, steam rooms, or saunas.  Do not douche or use tampons or scented sanitary pads.  Do not cross your legs for long periods of time.  Avoid cat litter boxes and soil used by cats. These carry germs that can cause birth defects in the baby and possibly loss of the fetus by miscarriage or stillbirth.  Avoid all smoking, herbs, alcohol, and medicines not prescribed by your health care provider. Chemicals in these products affect the formation and growth of the baby.  Do not use any products that contain nicotine or tobacco, such as cigarettes and e-cigarettes. If you need help quitting, ask your health care provider. You may receive counseling support and other resources to help you quit.  Schedule a dentist appointment. At home, brush your teeth with a soft toothbrush and be gentle when you floss. Contact a health care provider if:  You have dizziness.  You have mild pelvic cramps, pelvic pressure, or nagging pain in the abdominal area.  You have persistent nausea, vomiting, or diarrhea.  You have a bad smelling vaginal discharge.  You have pain when you urinate.  You notice increased swelling in your face, hands, legs, or ankles.  You are exposed to fifth disease or chickenpox.  You are exposed to German measles (rubella) and have never had it. Get help right away if:  You have a fever.  You are leaking fluid from your vagina.  You have spotting or bleeding from your vagina.  You have severe abdominal cramping or pain.  You have rapid weight gain or loss.  You vomit blood or material that looks like coffee grounds.  You develop a severe headache.  You have shortness of breath.  You have any kind of trauma, such as from a fall or a car accident. Summary  The first trimester of pregnancy is from week 1 until the end of week 13 (months 1 through 3).  Your body goes through many changes during pregnancy. The changes vary from  woman to woman.  You will have routine prenatal visits. During those visits, your health care provider will examine you, discuss any test results you may have, and talk with you about how you are feeling. This information is not intended to replace advice given to you by your health care provider. Make sure you discuss any questions you have with your health care provider. Document Released: 03/05/2001 Document Revised: 02/21/2016 Document Reviewed: 02/21/2016 Elsevier Interactive Patient Education  2017 Elsevier   Inc.  

## 2017-01-29 NOTE — Progress Notes (Signed)
Subjective:     Patient ID: Maria Serrano, female   DOB: [birth year], 21 y.o.   MRN: [id number]  HPI Maria Serrano is a 21 year old white female in for UPT, has missed a period.She had D&E at 19+6 weeks, baby had trisomy 6018, in August, at [facility].   Review of Systems +missed period Denies any nausea,vomiting or spotting  Reviewed past medical,surgical, social and family history. Reviewed medications and allergies.     Objective:   Physical Exam BP 118/62 (BP Location: Left Arm, Patient Position: Sitting, Cuff Size: Normal)   Pulse 80   Resp 18   Ht 5\' 3"  (1.6 m)   Wt 177 lb 8 oz (80.5 kg)   LMP 12/19/2016 (Exact Date)   BMI 31.44 kg/m +UPT, about 5+6 weeks by LMP with EDD 09/25/17.Skin warm and dry. Neck: mid line trachea, normal thyroid, good ROM, no lymphadenopathy noted. Lungs: clear to ausculation bilaterally. Cardiovascular: regular rate and rhythm.Abdomen is soft and non tender.  PHQ 2 score 0.     Assessment:     1. Pregnancy test positive   2. Less than [redacted] weeks gestation of pregnancy   3. Encounter to determine fetal viability of pregnancy, single or unspecified fetus   4. Family history of trisomy 4418       Plan:     Continue PNV Return in 1 week for dating US Will get chromosomal testing at 10 weeks    Review handouts on first trimester and by [facility]

## 2017-02-03 ENCOUNTER — Other Ambulatory Visit: Payer: Self-pay | Admitting: Adult Health

## 2017-02-03 ENCOUNTER — Ambulatory Visit (INDEPENDENT_AMBULATORY_CARE_PROVIDER_SITE_OTHER): Payer: BLUE CROSS/BLUE SHIELD

## 2017-02-03 DIAGNOSIS — O3680X0 Pregnancy with inconclusive fetal viability, not applicable or unspecified: Secondary | ICD-10-CM | POA: Diagnosis not present

## 2017-02-03 DIAGNOSIS — Z3A01 Less than 8 weeks gestation of pregnancy: Secondary | ICD-10-CM | POA: Diagnosis not present

## 2017-02-03 NOTE — Progress Notes (Signed)
US 5+5 wks GS w/ys,no fetal pole visualized,GS 9.7 mm,normal ovaries bilat,subchorionic hemorrhage 1.8 x .7 x 1.8 cm,pt will come back in 10 days for f/u ultrasound

## 2017-02-12 ENCOUNTER — Other Ambulatory Visit: Payer: Self-pay | Admitting: Obstetrics & Gynecology

## 2017-02-12 DIAGNOSIS — O3680X0 Pregnancy with inconclusive fetal viability, not applicable or unspecified: Secondary | ICD-10-CM

## 2017-02-17 ENCOUNTER — Other Ambulatory Visit: Payer: BLUE CROSS/BLUE SHIELD

## 2017-02-17 ENCOUNTER — Ambulatory Visit (INDEPENDENT_AMBULATORY_CARE_PROVIDER_SITE_OTHER): Payer: BLUE CROSS/BLUE SHIELD

## 2017-02-17 DIAGNOSIS — O3680X0 Pregnancy with inconclusive fetal viability, not applicable or unspecified: Secondary | ICD-10-CM | POA: Diagnosis not present

## 2017-02-17 DIAGNOSIS — Z3A01 Less than 8 weeks gestation of pregnancy: Secondary | ICD-10-CM | POA: Diagnosis not present

## 2017-02-17 NOTE — Progress Notes (Signed)
US 7+2 wks,single IUP w/ys,positive fht 149 bpm,normal ovaries bilat,subchorionic hemorrhage 3.5 x 1.2 x 1.4 cm,results discussed w/Jennifer,EDD 10/04/2017

## 2017-02-24 ENCOUNTER — Telehealth: Payer: Self-pay | Admitting: Women's Health

## 2017-02-24 MED ORDER — PROMETHAZINE HCL 25 MG PO TABS: 25.0000 mg | ORAL_TABLET | Freq: Four times a day (QID) | ORAL | 1 refills | Status: DC | PRN

## 2017-02-24 NOTE — Telephone Encounter (Signed)
Patient called with complaints of nausea all day. She is requesting something if possible. Pt has [employer] primary and MCD secondary. Please advise.

## 2017-02-24 NOTE — Telephone Encounter (Signed)
Pt having nausea and vomiting , will rx phenergan also has headache, Ok to take ES tylenol.

## 2017-03-05 ENCOUNTER — Encounter: Payer: BLUE CROSS/BLUE SHIELD | Admitting: Advanced Practice Midwife

## 2017-03-05 ENCOUNTER — Ambulatory Visit: Payer: BLUE CROSS/BLUE SHIELD | Admitting: *Deleted

## 2017-03-13 ENCOUNTER — Encounter: Payer: Self-pay | Admitting: Advanced Practice Midwife

## 2017-03-13 ENCOUNTER — Ambulatory Visit (INDEPENDENT_AMBULATORY_CARE_PROVIDER_SITE_OTHER): Payer: BLUE CROSS/BLUE SHIELD | Admitting: Advanced Practice Midwife

## 2017-03-13 ENCOUNTER — Other Ambulatory Visit (HOSPITAL_COMMUNITY): Payer: Self-pay | Admitting: Advanced Practice Midwife

## 2017-03-13 ENCOUNTER — Ambulatory Visit: Payer: BLUE CROSS/BLUE SHIELD | Admitting: *Deleted

## 2017-03-13 VITALS — BP 108/76 | HR 62 | Wt 170.0 lb

## 2017-03-13 DIAGNOSIS — Z3A10 10 weeks gestation of pregnancy: Secondary | ICD-10-CM | POA: Diagnosis not present

## 2017-03-13 DIAGNOSIS — Z8279 Family history of other congenital malformations, deformations and chromosomal abnormalities: Secondary | ICD-10-CM | POA: Insufficient documentation

## 2017-03-13 DIAGNOSIS — Z3481 Encounter for supervision of other normal pregnancy, first trimester: Secondary | ICD-10-CM

## 2017-03-13 DIAGNOSIS — Z331 Pregnant state, incidental: Secondary | ICD-10-CM

## 2017-03-13 DIAGNOSIS — Z8481 Family history of carrier of genetic disease: Secondary | ICD-10-CM | POA: Insufficient documentation

## 2017-03-13 DIAGNOSIS — O09291 Supervision of pregnancy with other poor reproductive or obstetric history, first trimester: Secondary | ICD-10-CM | POA: Diagnosis not present

## 2017-03-13 DIAGNOSIS — Z1389 Encounter for screening for other disorder: Secondary | ICD-10-CM

## 2017-03-13 DIAGNOSIS — Z23 Encounter for immunization: Secondary | ICD-10-CM | POA: Diagnosis not present

## 2017-03-13 DIAGNOSIS — Z3490 Encounter for supervision of normal pregnancy, unspecified, unspecified trimester: Secondary | ICD-10-CM | POA: Insufficient documentation

## 2017-03-13 LAB — POCT URINALYSIS DIPSTICK
Blood, UA: NEGATIVE
GLUCOSE UA: NEGATIVE
KETONES UA: NEGATIVE
Leukocytes, UA: NEGATIVE
Nitrite, UA: NEGATIVE
Protein, UA: NEGATIVE

## 2017-03-13 NOTE — Progress Notes (Signed)
  Subjective:    Maria HickmanBrittany N Cerullo is a G2P0010 3611w5d being seen today for her first obstetrical visit.  Her obstetrical history is significant for T18 w/last baby, SAB 19 weeks.  Pregnancy history fully reviewed.  Patient reports a little nausea, doesn't want to take any meds.  Having a hard time getting excited d/t hx . Wants [facility] ASAP.  Marland Kitchen.  Vitals:   03/13/17 1443  BP: 108/76  Pulse: 62  Weight: 170 lb (77.1 kg)    HISTORY: OB History  Gravida Para Term Preterm AB Living  2 0   0 1    SAB TAB Ectopic Multiple Live Births  1            # Outcome Date GA Lbr Len/2nd Weight Sex Delivery Anes PTL Lv  2 Current           1 SAB 11/05/16 6939w6d      N FD     Complications: Trisomy 18     Past Medical History:  Diagnosis Date  . Medical history non-contributory    Past Surgical History:  Procedure Laterality Date  . DILATION AND CURETTAGE OF UTERUS     Family History  Problem Relation Age of Onset  . COPD Maternal Grandfather   . Cancer Maternal Grandfather        lung  . Hypertension Father   . Endometriosis Maternal Aunt      Exam                                      System:     Skin: normal coloration and turgor, no rashes    Neurologic: oriented, normal, normal mood   Extremities: normal strength, tone, and muscle mass   HEENT PERRLA   Mouth/Teeth mucous membranes moist, normal dentition   Neck supple and no masses   Cardiovascular: regular rate and rhythm   Respiratory:  appears well, vitals normal, no respiratory distress, acyanotic   Abdomen: soft, non-tender;  FHR: 160 US        The nature of [facility] with multiple MDs and other Advanced Practice Providers was explained to patient; also emphasized that residents, students are part of our team.  Assessment:    Pregnancy: G2P0010 Patient Active Problem List   Diagnosis Date Noted  . Supervision of normal pregnancy 03/13/2017  . Trisomy 18 in child  of prior pregnancy, currently pregnant in first trimester 03/13/2017  . Family history of trisomy 3518 01/29/2017  . Encounter to determine fetal viability of pregnancy 01/29/2017  . Pregnancy test positive 01/29/2017  . Less than [redacted] weeks gestation of pregnancy 01/29/2017  . [redacted] weeks gestation of pregnancy         Plan:     Initial labs drawn. Informasique added Continue prenatal vitamins  Problem list reviewed and updated  Reviewed n/v relief measures and warning s/s to report  Reviewed recommended weight gain based on pre-gravid BMI  Encouraged well-balanced diet Genetic Screening discussed : .  Ultrasound discussed; fetal survey: requested.  Return in about 3 weeks (around 04/03/2017) for LROB.  CRESENZO-DISHMAN,Anneka Studer 03/13/2017

## 2017-03-13 NOTE — Patient Instructions (Signed)
 First Trimester of Pregnancy The first trimester of pregnancy is from week 1 until the end of week 12 (months 1 through 3). A week after a sperm fertilizes an egg, the egg will implant on the wall of the uterus. This embryo will begin to develop into a baby. Genes from you and your partner are forming the baby. The female genes determine whether the baby is a boy or a girl. At 6-8 weeks, the eyes and face are formed, and the heartbeat can be seen on ultrasound. At the end of 12 weeks, all the baby's organs are formed.  Now that you are pregnant, you will want to do everything you can to have a healthy baby. Two of the most important things are to get good prenatal care and to follow your health care provider's instructions. Prenatal care is all the medical care you receive before the baby's birth. This care will help prevent, find, and treat any problems during the pregnancy and childbirth. BODY CHANGES Your body goes through many changes during pregnancy. The changes vary from woman to woman.   You may gain or lose a couple of pounds at first.  You may feel sick to your stomach (nauseous) and throw up (vomit). If the vomiting is uncontrollable, call your health care provider.  You may tire easily.  You may develop headaches that can be relieved by medicines approved by your health care provider.  You may urinate more often. Painful urination may mean you have a bladder infection.  You may develop heartburn as a result of your pregnancy.  You may develop constipation because certain hormones are causing the muscles that push waste through your intestines to slow down.  You may develop hemorrhoids or swollen, bulging veins (varicose veins).  Your breasts may begin to grow larger and become tender. Your nipples may stick out more, and the tissue that surrounds them (areola) may become darker.  Your gums may bleed and may be sensitive to brushing and flossing.  Dark spots or blotches  (chloasma, mask of pregnancy) may develop on your face. This will likely fade after the baby is born.  Your menstrual periods will stop.  You may have a loss of appetite.  You may develop cravings for certain kinds of food.  You may have changes in your emotions from day to day, such as being excited to be pregnant or being concerned that something may go wrong with the pregnancy and baby.  You may have more vivid and strange dreams.  You may have changes in your hair. These can include thickening of your hair, rapid growth, and changes in texture. Some women also have hair loss during or after pregnancy, or hair that feels dry or thin. Your hair will most likely return to normal after your baby is born. WHAT TO EXPECT AT YOUR PRENATAL VISITS During a routine prenatal visit:  You will be weighed to make sure you and the baby are growing normally.  Your blood pressure will be taken.  Your abdomen will be measured to track your baby's growth.  The fetal heartbeat will be listened to starting around week 10 or 12 of your pregnancy.  Test results from any previous visits will be discussed. Your health care provider may ask you:  How you are feeling.  If you are feeling the baby move.  If you have had any abnormal symptoms, such as leaking fluid, bleeding, severe headaches, or abdominal cramping.  If you have any questions. Other   tests that may be performed during your first trimester include:  Blood tests to find your blood type and to check for the presence of any previous infections. They will also be used to check for low iron levels (anemia) and Rh antibodies. Later in the pregnancy, blood tests for diabetes will be done along with other tests if problems develop.  Urine tests to check for infections, diabetes, or protein in the urine.  An ultrasound to confirm the proper growth and development of the baby.  An amniocentesis to check for possible genetic problems.  Fetal  screens for spina bifida and Down syndrome.  You may need other tests to make sure you and the baby are doing well. HOME CARE INSTRUCTIONS  Medicines  Follow your health care provider's instructions regarding medicine use. Specific medicines may be either safe or unsafe to take during pregnancy.  Take your prenatal vitamins as directed.  If you develop constipation, try taking a stool softener if your health care provider approves. Diet  Eat regular, well-balanced meals. Choose a variety of foods, such as meat or vegetable-based protein, fish, milk and low-fat dairy products, vegetables, fruits, and whole grain breads and cereals. Your health care provider will help you determine the amount of weight gain that is right for you.  Avoid raw meat and uncooked cheese. These carry germs that can cause birth defects in the baby.  Eating four or five small meals rather than three large meals a day may help relieve nausea and vomiting. If you start to feel nauseous, eating a few soda crackers can be helpful. Drinking liquids between meals instead of during meals also seems to help nausea and vomiting.  If you develop constipation, eat more high-fiber foods, such as fresh vegetables or fruit and whole grains. Drink enough fluids to keep your urine clear or pale yellow. Activity and Exercise  Exercise only as directed by your health care provider. Exercising will help you:  Control your weight.  Stay in shape.  Be prepared for labor and delivery.  Experiencing pain or cramping in the lower abdomen or low back is a good sign that you should stop exercising. Check with your health care provider before continuing normal exercises.  Try to avoid standing for long periods of time. Move your legs often if you must stand in one place for a long time.  Avoid heavy lifting.  Wear low-heeled shoes, and practice good posture.  You may continue to have sex unless your health care provider directs you  otherwise. Relief of Pain or Discomfort  Wear a good support bra for breast tenderness.   Take warm sitz baths to soothe any pain or discomfort caused by hemorrhoids. Use hemorrhoid cream if your health care provider approves.   Rest with your legs elevated if you have leg cramps or low back pain.  If you develop varicose veins in your legs, wear support hose. Elevate your feet for 15 minutes, 3-4 times a day. Limit salt in your diet. Prenatal Care  Schedule your prenatal visits by the twelfth week of pregnancy. They are usually scheduled monthly at first, then more often in the last 2 months before delivery.  Write down your questions. Take them to your prenatal visits.  Keep all your prenatal visits as directed by your health care provider. Safety  Wear your seat belt at all times when driving.  Make a list of emergency phone numbers, including numbers for family, friends, the hospital, and police and fire departments. General   Tips  Ask your health care provider for a referral to a local prenatal education class. Begin classes no later than at the beginning of month 6 of your pregnancy.  Ask for help if you have counseling or nutritional needs during pregnancy. Your health care provider can offer advice or refer you to specialists for help with various needs.  Do not use hot tubs, steam rooms, or saunas.  Do not douche or use tampons or scented sanitary pads.  Do not cross your legs for long periods of time.  Avoid cat litter boxes and soil used by cats. These carry germs that can cause birth defects in the baby and possibly loss of the fetus by miscarriage or stillbirth.  Avoid all smoking, herbs, alcohol, and medicines not prescribed by your health care provider. Chemicals in these affect the formation and growth of the baby.  Schedule a dentist appointment. At home, brush your teeth with a soft toothbrush and be gentle when you floss. SEEK MEDICAL CARE IF:   You have  dizziness.  You have mild pelvic cramps, pelvic pressure, or nagging pain in the abdominal area.  You have persistent nausea, vomiting, or diarrhea.  You have a bad smelling vaginal discharge.  You have pain with urination.  You notice increased swelling in your face, hands, legs, or ankles. SEEK IMMEDIATE MEDICAL CARE IF:   You have a fever.  You are leaking fluid from your vagina.  You have spotting or bleeding from your vagina.  You have severe abdominal cramping or pain.  You have rapid weight gain or loss.  You vomit blood or material that looks like coffee grounds.  You are exposed to German measles and have never had them.  You are exposed to fifth disease or chickenpox.  You develop a severe headache.  You have shortness of breath.  You have any kind of trauma, such as from a fall or a car accident. Document Released: 03/05/2001 Document Revised: 07/26/2013 Document Reviewed: 01/19/2013 ExitCare Patient Information 2015 [employer]. This information is not intended to replace advice given to you by your health care provider. Make sure you discuss any questions you have with your health care provider.   Nausea & Vomiting  Have saltine crackers or pretzels by your bed and eat a few bites before you raise your head out of bed in the morning  Eat small frequent meals throughout the day instead of large meals  Drink plenty of fluids throughout the day to stay hydrated, just don't drink a lot of fluids with your meals.  This can make your stomach fill up faster making you feel sick  Do not brush your teeth right after you eat  Products with real ginger are good for nausea, like ginger ale and ginger hard candy Make sure it says made with real ginger!  Sucking on sour candy like lemon heads is also good for nausea  If your prenatal vitamins make you nauseated, take them at night so you will sleep through the nausea  Sea Bands  If you feel like you need  medicine for the nausea & vomiting please let us know  If you are unable to keep any fluids or food down please let us know   Constipation  Drink plenty of fluid, preferably water, throughout the day  Eat foods high in fiber such as fruits, vegetables, and grains  Exercise, such as walking, is a good way to keep your bowels regular  Drink warm fluids, especially warm   prune juice, or decaf coffee  Eat a 1/2 cup of real oatmeal (not instant), 1/2 cup applesauce, and 1/2-1 cup warm prune juice every day  If needed, you may take Colace (docusate sodium) stool softener once or twice a day to help keep the stool soft. If you are pregnant, wait until you are out of your first trimester (12-14 weeks of pregnancy)  If you still are having problems with constipation, you may take Miralax once daily as needed to help keep your bowels regular.  If you are pregnant, wait until you are out of your first trimester (12-14 weeks of pregnancy)  Safe Medications in Pregnancy   Acne: Benzoyl Peroxide Salicylic Acid  Backache/Headache: Tylenol: 2 regular strength every 4 hours OR              2 Extra strength every 6 hours  Colds/Coughs/Allergies: Benadryl (alcohol free) 25 mg every 6 hours as needed Breath right strips Claritin Cepacol throat lozenges Chloraseptic throat spray Cold-Eeze- up to three times per day Cough drops, alcohol free Flonase (by prescription only) Guaifenesin Mucinex Robitussin DM (plain only, alcohol free) Saline nasal spray/drops Sudafed (pseudoephedrine) & Actifed ** use only after [redacted] weeks gestation and if you do not have high blood pressure Tylenol Vicks Vaporub Zinc lozenges Zyrtec   Constipation: Colace Ducolax suppositories Fleet enema Glycerin suppositories Metamucil Milk of magnesia Miralax Senokot Smooth move tea  Diarrhea: Kaopectate Imodium A-D  *NO pepto Bismol  Hemorrhoids: Anusol Anusol HC Preparation  H Tucks  Indigestion: Tums Maalox Mylanta Zantac  Pepcid  Insomnia: Benadryl (alcohol free) 25mg every 6 hours as needed Tylenol PM Unisom, no Gelcaps  Leg Cramps: Tums MagGel  Nausea/Vomiting:  Bonine Dramamine Emetrol Ginger extract Sea bands Meclizine  Nausea medication to take during pregnancy:  Unisom (doxylamine succinate 25 mg tablets) Take one tablet daily at bedtime. If symptoms are not adequately controlled, the dose can be increased to a maximum recommended dose of two tablets daily (1/2 tablet in the morning, 1/2 tablet mid-afternoon and one at bedtime). Vitamin B6 100mg tablets. Take one tablet twice a day (up to 200 mg per day).  Skin Rashes: Aveeno products Benadryl cream or 25mg every 6 hours as needed Calamine Lotion 1% cortisone cream  Yeast infection: Gyne-lotrimin 7 Monistat 7   **If taking multiple medications, please check labels to avoid duplicating the same active ingredients **take medication as directed on the label ** Do not exceed 4000 mg of tylenol in 24 hours **Do not take medications that contain aspirin or ibuprofen      

## 2017-03-14 LAB — PMP SCREEN PROFILE (10S), URINE
Amphetamine Scrn, Ur: NEGATIVE ng/mL
BARBITURATE SCREEN URINE: NEGATIVE ng/mL
BENZODIAZEPINE SCREEN, URINE: NEGATIVE ng/mL
CANNABINOIDS UR QL SCN: NEGATIVE ng/mL
CREATININE(CRT), U: 63.1 mg/dL (ref 20.0–300.0)
Cocaine (Metab) Scrn, Ur: NEGATIVE ng/mL
METHADONE SCREEN, URINE: NEGATIVE ng/mL
OPIATE SCREEN URINE: NEGATIVE ng/mL
OXYCODONE+OXYMORPHONE UR QL SCN: NEGATIVE ng/mL
PH UR, DRUG SCRN: 6.2 (ref 4.5–8.9)
PROPOXYPHENE SCREEN URINE: NEGATIVE ng/mL
Phencyclidine Qn, Ur: NEGATIVE ng/mL

## 2017-03-15 LAB — HIV ANTIBODY (ROUTINE TESTING W REFLEX): HIV SCREEN 4TH GENERATION: NONREACTIVE

## 2017-03-15 LAB — HEPATITIS B SURFACE ANTIGEN: HEP B S AG: NEGATIVE

## 2017-03-15 LAB — RUBELLA SCREEN: RUBELLA: 0.92 {index} — AB (ref >0.99)

## 2017-03-15 LAB — URINE CULTURE

## 2017-03-15 LAB — URINALYSIS, ROUTINE W REFLEX MICROSCOPIC
Bilirubin, UA: NEGATIVE
Glucose, UA: NEGATIVE
Ketones, UA: NEGATIVE
Leukocytes, UA: NEGATIVE
Nitrite, UA: NEGATIVE
PH UA: 6.5 (ref 5.0–7.5)
PROTEIN UA: NEGATIVE
RBC, UA: NEGATIVE
Specific Gravity, UA: 1.012 (ref 1.005–1.030)
UUROB: 0.2 mg/dL (ref 0.2–1.0)

## 2017-03-15 LAB — CBC
HEMATOCRIT: 39.1 % (ref 34.0–46.6)
HEMOGLOBIN: 13.7 g/dL (ref 11.1–15.9)
MCH: 33.1 pg — ABNORMAL HIGH (ref 26.6–33.0)
MCHC: 35 g/dL (ref 31.5–35.7)
MCV: 94 fL (ref 79–97)
Platelets: 258 10*3/uL (ref 150–379)
RBC: 4.14 x10E6/uL (ref 3.77–5.28)
RDW: 13.1 % (ref 12.3–15.4)
WBC: 8.4 10*3/uL (ref 3.4–10.8)

## 2017-03-15 LAB — RPR: RPR: NONREACTIVE

## 2017-03-15 LAB — ANTIBODY SCREEN: Antibody Screen: NEGATIVE

## 2017-03-16 LAB — GC/CHLAMYDIA PROBE AMP
CHLAMYDIA, DNA PROBE: NEGATIVE
NEISSERIA GONORRHOEAE BY PCR: NEGATIVE

## 2017-03-19 ENCOUNTER — Encounter: Payer: Self-pay | Admitting: Advanced Practice Midwife

## 2017-03-19 DIAGNOSIS — Z2839 Other underimmunization status: Secondary | ICD-10-CM | POA: Insufficient documentation

## 2017-03-19 DIAGNOSIS — Z283 Underimmunization status: Secondary | ICD-10-CM | POA: Insufficient documentation

## 2017-03-19 DIAGNOSIS — O9989 Other specified diseases and conditions complicating pregnancy, childbirth and the puerperium: Secondary | ICD-10-CM

## 2017-03-19 DIAGNOSIS — O09899 Supervision of other high risk pregnancies, unspecified trimester: Secondary | ICD-10-CM | POA: Insufficient documentation

## 2017-03-20 ENCOUNTER — Encounter (HOSPITAL_COMMUNITY): Payer: Self-pay | Admitting: *Deleted

## 2017-03-20 ENCOUNTER — Inpatient Hospital Stay (HOSPITAL_COMMUNITY)
Admission: AD | Admit: 2017-03-20 | Discharge: 2017-03-20 | Disposition: A | Discharge status: Home / Self Care | Payer: BLUE CROSS/BLUE SHIELD | Source: Ambulatory Visit | Attending: Family Medicine | Admitting: Family Medicine

## 2017-03-20 DIAGNOSIS — O208 Other hemorrhage in early pregnancy: Secondary | ICD-10-CM | POA: Insufficient documentation

## 2017-03-20 DIAGNOSIS — N939 Abnormal uterine and vaginal bleeding, unspecified: Secondary | ICD-10-CM | POA: Diagnosis not present

## 2017-03-20 DIAGNOSIS — Z3A11 11 weeks gestation of pregnancy: Secondary | ICD-10-CM | POA: Diagnosis not present

## 2017-03-20 DIAGNOSIS — O468X1 Other antepartum hemorrhage, first trimester: Secondary | ICD-10-CM

## 2017-03-20 DIAGNOSIS — Z3491 Encounter for supervision of normal pregnancy, unspecified, first trimester: Secondary | ICD-10-CM

## 2017-03-20 DIAGNOSIS — Z87891 Personal history of nicotine dependence: Secondary | ICD-10-CM | POA: Insufficient documentation

## 2017-03-20 DIAGNOSIS — Z8249 Family history of ischemic heart disease and other diseases of the circulatory system: Secondary | ICD-10-CM | POA: Insufficient documentation

## 2017-03-20 DIAGNOSIS — O418X10 Other specified disorders of amniotic fluid and membranes, first trimester, not applicable or unspecified: Secondary | ICD-10-CM | POA: Diagnosis not present

## 2017-03-20 DIAGNOSIS — Z880 Allergy status to penicillin: Secondary | ICD-10-CM | POA: Diagnosis not present

## 2017-03-20 DIAGNOSIS — O209 Hemorrhage in early pregnancy, unspecified: Secondary | ICD-10-CM | POA: Diagnosis not present

## 2017-03-20 LAB — URINALYSIS, ROUTINE W REFLEX MICROSCOPIC
Bilirubin Urine: NEGATIVE
GLUCOSE, UA: NEGATIVE mg/dL
KETONES UR: NEGATIVE mg/dL
LEUKOCYTES UA: NEGATIVE
Nitrite: NEGATIVE
PH: 6 (ref 5.0–8.0)
PROTEIN: NEGATIVE mg/dL
Specific Gravity, Urine: 1.004 — ABNORMAL LOW (ref 1.005–1.030)

## 2017-03-20 NOTE — Discharge Instructions (Signed)
Subchorionic Hematoma °A subchorionic hematoma is a gathering of blood between the outer wall of the placenta and the inner wall of the womb (uterus). The placenta is the organ that connects the fetus to the wall of the uterus. The placenta performs the feeding, breathing (oxygen to the fetus), and waste removal (excretory work) of the fetus. °Subchorionic hematoma is the most common abnormality found on a result from ultrasonography done during the first trimester or early second trimester of pregnancy. If there has been little or no vaginal bleeding, early small hematomas usually shrink on their own and do not affect your baby or pregnancy. The blood is gradually absorbed over 1-2 weeks. When bleeding starts later in pregnancy or the hematoma is larger or occurs in an older pregnant woman, the outcome may not be as good. Larger hematomas may get bigger, which increases the chances for miscarriage. Subchorionic hematoma also increases the risk of premature detachment of the placenta from the uterus, preterm (premature) labor, and stillbirth. °Follow these instructions at home: °· Stay on bed rest if your health care provider recommends this. Although bed rest will not prevent more bleeding or prevent a miscarriage, your health care provider may recommend bed rest until you are advised otherwise. °· Avoid heavy lifting (more than 10 lb [4.5 kg]), exercise, sexual intercourse, or douching as directed by your health care provider. °· Keep track of the number of pads you use each day and how soaked (saturated) they are. Write down this information. °· Do not use tampons. °· Keep all follow-up appointments as directed by your health care provider. Your health care provider may ask you to have follow-up blood tests or ultrasound tests or both. °Get help right away if: °· You have severe cramps in your stomach, back, abdomen, or pelvis. °· You have a fever. °· You pass large clots or tissue. Save any tissue for your  health care provider to look at. °· Your bleeding increases or you become lightheaded, feel weak, or have fainting episodes. °This information is not intended to replace advice given to you by your health care provider. Make sure you discuss any questions you have with your health care provider. °Document Released: 06/26/2006 Document Revised: 08/17/2015 Document Reviewed: 10/08/2012 °Elsevier Interactive Patient Education © 2017 [employer] ° °

## 2017-03-20 NOTE — MAU Provider Note (Signed)
History     CSN: [id number]  Arrival date and time: 03/20/17 [id number]   First Provider Initiated Contact with Patient 03/20/17 2047      Chief Complaint  Patient presents with  . Vaginal Bleeding   HPI Maria Serrano is a 21 y.o. G2P0010 at 1623w5d who presents with vaginal bleeding. [facility] & had ultrasound last month that confirmed IUP. Small SCH on that ultrasound. Bleeding began today. States she soaked through 1 pad in an hour & that she has continued to bleed but not as heavy. Denies abdominal pain. No recent intercourse.   OB History    Gravida Para Term Preterm AB Living   2 0   0 1     SAB TAB Ectopic Multiple Live Births   1              Past Medical History:  Diagnosis Date  . Medical history non-contributory     Past Surgical History:  Procedure Laterality Date  . DILATION AND CURETTAGE OF UTERUS      Family History  Problem Relation Age of Onset  . COPD Maternal Grandfather   . Cancer Maternal Grandfather        lung  . Hypertension Father   . Endometriosis Maternal Aunt     Social History   Tobacco Use  . Smoking status: Former Smoker    Years: 0.50  . Smokeless tobacco: Never Used  Substance Use Topics  . Alcohol use: No  . Drug use: No    Allergies:  Allergies  Allergen Reactions  . Amoxicillin Other (See Comments)    Childhood allergy.    Medications Prior to Admission  Medication Sig Dispense Refill Last Dose  . Prenatal MV-Min-FA-Omega-3 (PRENATAL GUMMIES/DHA & FA PO) Take by mouth. Takes 2 daily   Taking  . promethazine (PHENERGAN) 25 MG tablet Take 1 tablet (25 mg total) by mouth every 6 (six) hours as needed for nausea or vomiting. (Patient not taking: Reported on 03/13/2017) 30 tablet 1 Not Taking    Review of Systems  Constitutional: Negative.   Gastrointestinal: Negative.   Genitourinary: Positive for vaginal bleeding.   Physical Exam   Blood pressure 117/61, pulse (!) 112, temperature  98.5 F (36.9 C), temperature source Oral, resp. rate 20, height 5\' 3"  (1.6 m), weight 171 lb 4 oz (77.7 kg), last menstrual period 12/19/2016, unknown if currently breastfeeding.  Physical Exam  Nursing note and vitals reviewed. Constitutional: She is oriented to person, place, and time. She appears well-developed and well-nourished. No distress.  HENT:  Head: Normocephalic and atraumatic.  Eyes: Conjunctivae are normal. Right eye exhibits no discharge. Left eye exhibits no discharge. No scleral icterus.  Neck: Normal range of motion.  Respiratory: Effort normal. No respiratory distress.  GI: Soft. There is no tenderness.  Genitourinary: There is bleeding (small amount of dark red mucoid blood; no active bleeding; cervix closed) in the vagina.  Neurological: She is alert and oriented to person, place, and time.  Skin: Skin is warm and dry. She is not diaphoretic.  Psychiatric: She has a normal mood and affect. Her behavior is normal. Judgment and thought content normal.    MAU Course  Procedures Results for orders placed or performed during the hospital encounter of 03/20/17 (from the past 24 hour(s))  Urinalysis, Routine w reflex microscopic     Status: Abnormal   Collection Time: 03/20/17  6:57 PM  Result Value Ref Range   Color,  Urine STRAW (A) YELLOW   APPearance CLEAR CLEAR   Specific Gravity, Urine 1.004 (L) 1.005 - 1.030   pH 6.0 5.0 - 8.0   Glucose, UA NEGATIVE NEGATIVE mg/dL   Hgb urine dipstick MODERATE (A) NEGATIVE   Bilirubin Urine NEGATIVE NEGATIVE   Ketones, ur NEGATIVE NEGATIVE mg/dL   Protein, ur NEGATIVE NEGATIVE mg/dL   Nitrite NEGATIVE NEGATIVE   Leukocytes, UA NEGATIVE NEGATIVE   RBC / HPF 0-5 0 - 5 RBC/hpf   WBC, UA 0-5 0 - 5 WBC/hpf   Bacteria, UA RARE (A) NONE SEEN   Squamous Epithelial / LPF 0-5 (A) NONE SEEN    MDM A positive  No active bleeding & cervix closed Informal bedside ultrasound shows active fetus with FHR of 171 Pt reassured by exam.  [facility]  Assessment and Plan  A: 1. Fetal heart tones present, first trimester   2. [redacted] weeks gestation of pregnancy   3. Subchorionic hematoma in first trimester, single or unspecified fetus   4. Vaginal bleeding in pregnancy, first trimester    P: Discharge home Pelvic rest Bleeding precautions Keep f/u with OB  Judeth HornErin Farhaan Mabee 03/20/2017, 8:47 PM

## 2017-03-20 NOTE — MAU Note (Signed)
PT SAYS VAG BLEEDING  STARTED AT 5PM-   FELT SOME  CRAMPS  NOW.    [facility] BEFORE  TODAY  - ALL OK   .   HAD U/S IN OFFICE .

## 2017-03-22 ENCOUNTER — Emergency Department (HOSPITAL_COMMUNITY): Payer: BLUE CROSS/BLUE SHIELD

## 2017-03-22 ENCOUNTER — Emergency Department (HOSPITAL_COMMUNITY)
Admission: EM | Admit: 2017-03-22 | Discharge: 2017-03-22 | Disposition: A | Discharge status: Home / Self Care | Payer: BLUE CROSS/BLUE SHIELD | Attending: Emergency Medicine | Admitting: Emergency Medicine

## 2017-03-22 ENCOUNTER — Encounter (HOSPITAL_COMMUNITY): Payer: Self-pay | Admitting: Emergency Medicine

## 2017-03-22 ENCOUNTER — Encounter: Payer: Self-pay | Admitting: Women's Health

## 2017-03-22 DIAGNOSIS — O26811 Pregnancy related exhaustion and fatigue, first trimester: Secondary | ICD-10-CM | POA: Insufficient documentation

## 2017-03-22 DIAGNOSIS — O99511 Diseases of the respiratory system complicating pregnancy, first trimester: Secondary | ICD-10-CM | POA: Diagnosis not present

## 2017-03-22 DIAGNOSIS — R0600 Dyspnea, unspecified: Secondary | ICD-10-CM | POA: Insufficient documentation

## 2017-03-22 DIAGNOSIS — Z3490 Encounter for supervision of normal pregnancy, unspecified, unspecified trimester: Secondary | ICD-10-CM

## 2017-03-22 DIAGNOSIS — R0602 Shortness of breath: Secondary | ICD-10-CM | POA: Diagnosis not present

## 2017-03-22 DIAGNOSIS — Z87891 Personal history of nicotine dependence: Secondary | ICD-10-CM | POA: Diagnosis not present

## 2017-03-22 DIAGNOSIS — Z3A12 12 weeks gestation of pregnancy: Secondary | ICD-10-CM | POA: Diagnosis not present

## 2017-03-22 DIAGNOSIS — Z79899 Other long term (current) drug therapy: Secondary | ICD-10-CM | POA: Insufficient documentation

## 2017-03-22 LAB — CBC WITH DIFFERENTIAL/PLATELET
Basophils Absolute: 0 10*3/uL (ref 0.0–0.1)
Basophils Relative: 0 %
EOS ABS: 0 10*3/uL (ref 0.0–0.7)
Eosinophils Relative: 1 %
HEMATOCRIT: 34.7 % — AB (ref 36.0–46.0)
HEMOGLOBIN: 11.9 g/dL — AB (ref 12.0–15.0)
LYMPHS ABS: 1.5 10*3/uL (ref 0.7–4.0)
Lymphocytes Relative: 20 %
MCH: 31.9 pg (ref 26.0–34.0)
MCHC: 34.3 g/dL (ref 30.0–36.0)
MCV: 93 fL (ref 78.0–100.0)
MONO ABS: 0.5 10*3/uL (ref 0.1–1.0)
MONOS PCT: 6 %
NEUTROS ABS: 5.7 10*3/uL (ref 1.7–7.7)
NEUTROS PCT: 73 %
Platelets: 238 10*3/uL (ref 150–400)
RBC: 3.73 MIL/uL — ABNORMAL LOW (ref 3.87–5.11)
RDW: 12.1 % (ref 11.5–15.5)
WBC: 7.8 10*3/uL (ref 4.0–10.5)

## 2017-03-22 LAB — D-DIMER, QUANTITATIVE: D-Dimer, Quant: 0.43 ug/mL-FEU (ref 0.00–0.50)

## 2017-03-22 LAB — COMPREHENSIVE METABOLIC PANEL
ALK PHOS: 55 U/L (ref 38–126)
ALT: 17 U/L (ref 14–54)
AST: 17 U/L (ref 15–41)
Albumin: 3.2 g/dL — ABNORMAL LOW (ref 3.5–5.0)
Anion gap: 9 (ref 5–15)
BILIRUBIN TOTAL: 0.4 mg/dL (ref 0.3–1.2)
BUN: 9 mg/dL (ref 6–20)
CALCIUM: 8.6 mg/dL — AB (ref 8.9–10.3)
CO2: 20 mmol/L — ABNORMAL LOW (ref 22–32)
CREATININE: 0.53 mg/dL (ref 0.44–1.00)
Chloride: 106 mmol/L (ref 101–111)
GFR calc Af Amer: >60 mL/min (ref >60)
Glucose, Bld: 92 mg/dL (ref 65–99)
Potassium: 3.9 mmol/L (ref 3.5–5.1)
Sodium: 135 mmol/L (ref 135–145)
TOTAL PROTEIN: 6.6 g/dL (ref 6.5–8.1)

## 2017-03-22 LAB — PROTIME-INR
INR: 0.94
PROTHROMBIN TIME: 12.5 s (ref 11.4–15.2)

## 2017-03-22 LAB — TROPONIN I: Troponin I: <0.03 ng/mL (ref <0.03)

## 2017-03-22 MED ORDER — IOPAMIDOL (ISOVUE-370) INJECTION 76%
75.0000 mL | Freq: Once | INTRAVENOUS | Status: AC | PRN
  Administered 2017-03-22: 75 mL via INTRAVENOUS

## 2017-03-22 MED ORDER — SODIUM CHLORIDE 0.9 % IV BOLUS (SEPSIS)
1000.0000 mL | Freq: Once | INTRAVENOUS | Status: AC
  Administered 2017-03-22: 1000 mL via INTRAVENOUS

## 2017-03-22 NOTE — Discharge Instructions (Signed)
CT scan revealed no evidence of a blood clot in your lung.  Rest.  Increase fluids.  Follow-up with your OB/GYN next week.

## 2017-03-22 NOTE — ED Notes (Signed)
Unable to obtain fetal heart tone via extenal doppler. Dr. Adriana Simasook made aware.

## 2017-03-22 NOTE — ED Triage Notes (Signed)
Pt states she began having SOB this morning, worse with exertion. Denies CP, N/V/, dizziness, cold or congestion. [facility] on Thursday, still has some vaginal blding. States her father has factor V.

## 2017-03-22 NOTE — ED Notes (Addendum)
Corrected note - Iv fluids stopped at 1416. Total amount infused 1000 ML of NS.

## 2017-03-22 NOTE — ED Provider Notes (Signed)
[facility] EMERGENCY DEPARTMENT Provider Note   CSN: [id number] Arrival date & time: 03/22/17  1059     History   Chief Complaint Chief Complaint  Patient presents with  . Shortness of Breath    HPI Maria Serrano is a 21 y.o. female.  G2 P0 Ab1 approximately [redacted] weeks pregnant presents with worsening dyspnea and tachycardia.  This pregnancy has been complicated by a subchorionic hemorrhage.  Patient states that a previous ultrasound showed no ectopic pregnancy.  She has had a minimal amount of bleeding.  She admits to not drinking much fluid yesterday.  Father has factor V Leiden deficiency.  No fever, sweats, chills, dysuria, chest pain.  Severity of symptoms is moderate.  Exertion makes symptoms worse.      Past Medical History:  Diagnosis Date  . Medical history non-contributory     Patient Active Problem List   Diagnosis Date Noted  . Rubella non-immune status, antepartum 03/19/2017  . Supervision of normal pregnancy 03/13/2017  . Trisomy 18 in child of prior pregnancy, currently pregnant in first trimester 03/13/2017  . Family history of trisomy 43 01/29/2017  . Encounter to determine fetal viability of pregnancy 01/29/2017  . Pregnancy test positive 01/29/2017  . Less than [redacted] weeks gestation of pregnancy 01/29/2017  . [redacted] weeks gestation of pregnancy     Past Surgical History:  Procedure Laterality Date  . DILATION AND CURETTAGE OF UTERUS      OB History    Gravida Para Term Preterm AB Living   2 0   0 1     SAB TAB Ectopic Multiple Live Births   1               Home Medications    Prior to Admission medications   Medication Sig Start Date End Date Taking? Authorizing Provider  Prenatal MV-Min-FA-Omega-3 (PRENATAL GUMMIES/DHA & FA PO) Take by mouth. Takes 2 daily    [provider]  promethazine (PHENERGAN) 25 MG tablet Take 1 tablet (25 mg total) by mouth every 6 (six) hours as needed for nausea or vomiting. Patient not taking:  Reported on 03/13/2017 02/24/17   Adline Potter, NP    Family History Family History  Problem Relation Age of Onset  . COPD Maternal Grandfather   . Cancer Maternal Grandfather        lung  . Hypertension Father   . Endometriosis Maternal Aunt     Social History Social History   Tobacco Use  . Smoking status: Former Smoker    Years: 0.50  . Smokeless tobacco: Never Used  Substance Use Topics  . Alcohol use: No  . Drug use: No     Allergies   Amoxicillin   Review of Systems Review of Systems  All other systems reviewed and are negative.    Physical Exam Updated Vital Signs BP 98/61   Pulse 84   Temp 98.7 F (37.1 C) (Oral)   Resp 19   Ht 5\' 3"  (1.6 m)   Wt 77.7 kg (171 lb 4 oz)   LMP 12/19/2016 (Exact Date)   SpO2 100%   BMI 30.34 kg/m   Physical Exam  Constitutional: She is oriented to person, place, and time. She appears well-developed and well-nourished.  HENT:  Head: Normocephalic and atraumatic.  Eyes: Conjunctivae are normal.  Neck: Neck supple.  Cardiovascular: Normal rate and regular rhythm.  Pulmonary/Chest: Effort normal and breath sounds normal.  Abdominal: Soft. Bowel sounds are normal.  Gravid, nontender  Musculoskeletal: Normal range of motion.  Neurological: She is alert and oriented to person, place, and time.  Skin: Skin is warm and dry.  Psychiatric: She has a normal mood and affect. Her behavior is normal.  Nursing note and vitals reviewed.    ED Treatments / Results  Labs (all labs ordered are listed, but only abnormal results are displayed) Labs Reviewed  CBC WITH DIFFERENTIAL/PLATELET - Abnormal; Notable for the following components:      Result Value   RBC 3.73 (*)    Hemoglobin 11.9 (*)    HCT 34.7 (*)    All other components within normal limits  COMPREHENSIVE METABOLIC PANEL - Abnormal; Notable for the following components:   CO2 20 (*)    Calcium 8.6 (*)    Albumin 3.2 (*)    All other components within  normal limits  TROPONIN I  D-DIMER, QUANTITATIVE (NOT AT [facility])  PROTIME-INR    EKG  EKG Interpretation  Date/Time:  Saturday March 22 2017 11:13:33 EST Ventricular Rate:  107 PR Interval:    QRS Duration: 87 QT Interval:  330 QTC Calculation: 441 R Axis:   68 Text Interpretation:  Sinus tachycardia Borderline T wave abnormalities Confirmed by Donnetta Hutchingook, Merlin Ege ([id number]) on 03/22/2017 11:58:49 AM       Radiology Dg Chest 2 View  Result Date: 03/22/2017 CLINICAL DATA:  Shortness of breath EXAM: CHEST  2 VIEW COMPARISON:  None. FINDINGS: The lungs are clear without focal pneumonia, edema, pneumothorax or pleural effusion. The cardiopericardial silhouette is within normal limits for size. The visualized bony structures of the thorax are intact. IMPRESSION: No active cardiopulmonary disease. Electronically Signed   By: Kennith CenterEric  Mansell M.D.   On: 03/22/2017 12:27   Ct Angio Chest Pe W And/or Wo Contrast  Result Date: 03/22/2017 CLINICAL DATA:  Acute onset shortness of breath this morning. High pretest probability for pulmonary embolism. EXAM: CT ANGIOGRAPHY CHEST WITH CONTRAST TECHNIQUE: Multidetector CT imaging of the chest was performed using the standard protocol during bolus administration of intravenous contrast. Multiplanar CT image reconstructions and MIPs were obtained to evaluate the vascular anatomy. CONTRAST:  75mL ISOVUE-370 IOPAMIDOL (ISOVUE-370) INJECTION 76% COMPARISON:  None. FINDINGS: Cardiovascular: Satisfactory opacification of pulmonary arteries noted, and no pulmonary emboli identified. No evidence of thoracic aortic dissection or aneurysm. Mediastinum/Nodes: No masses or pathologically enlarged lymph nodes identified. Lungs/Pleura: No pulmonary mass, infiltrate, or effusion. Upper abdomen: No acute findings. Musculoskeletal: No suspicious bone lesions identified. Review of the MIP images confirms the above findings. IMPRESSION: Negative. No evidence of pulmonary embolism or  other active disease. Electronically Signed   By: Myles RosenthalJohn  Stahl M.D.   On: 03/22/2017 13:27    Procedures Procedures (including critical care time)  Medications Ordered in ED Medications  sodium chloride 0.9 % bolus 1,000 mL (1,000 mLs Intravenous New Bag/Given 03/22/17 1218)  iopamidol (ISOVUE-370) 76 % injection 75 mL (75 mLs Intravenous Contrast Given 03/22/17 1309)     Initial Impression / Assessment and Plan / ED Course  I have reviewed the triage vital signs and the nursing notes.  Pertinent labs & imaging results that were available during my care of the patient were reviewed by me and considered in my medical decision making (see chart for details).     12-week pregnant woman presents with dyspnea and tachycardia.  I considered the diagnosis of pulmonary embolism.  [facility]  We both agreed a CT angiogram of chest was medically warranted.  Fortunately, CT angiogram was negative for pulmonary embolism.  Patient felt much better with IV fluids.  She will follow-up with her OB next week for follow-up.  Final Clinical Impressions(s) / ED Diagnoses   Final diagnoses:  Pregnancy, unspecified gestational age    ED Discharge Orders    None       Donnetta Hutchingook, Saira Kramme, MD 03/22/17 1430

## 2017-03-24 ENCOUNTER — Telehealth: Payer: Self-pay | Admitting: *Deleted

## 2017-03-24 ENCOUNTER — Telehealth: Payer: Self-pay | Admitting: Women's Health

## 2017-03-24 ENCOUNTER — Ambulatory Visit (INDEPENDENT_AMBULATORY_CARE_PROVIDER_SITE_OTHER): Payer: BLUE CROSS/BLUE SHIELD | Admitting: Women's Health

## 2017-03-24 ENCOUNTER — Encounter: Payer: Self-pay | Admitting: Women's Health

## 2017-03-24 VITALS — BP 118/66 | HR 88 | Wt 172.0 lb

## 2017-03-24 DIAGNOSIS — O468X1 Other antepartum hemorrhage, first trimester: Secondary | ICD-10-CM

## 2017-03-24 DIAGNOSIS — Z1389 Encounter for screening for other disorder: Secondary | ICD-10-CM

## 2017-03-24 DIAGNOSIS — Z331 Pregnant state, incidental: Secondary | ICD-10-CM

## 2017-03-24 DIAGNOSIS — Z3682 Encounter for antenatal screening for nuchal translucency: Secondary | ICD-10-CM

## 2017-03-24 DIAGNOSIS — Z3481 Encounter for supervision of other normal pregnancy, first trimester: Secondary | ICD-10-CM

## 2017-03-24 DIAGNOSIS — Z3A12 12 weeks gestation of pregnancy: Secondary | ICD-10-CM

## 2017-03-24 DIAGNOSIS — O209 Hemorrhage in early pregnancy, unspecified: Secondary | ICD-10-CM | POA: Insufficient documentation

## 2017-03-24 DIAGNOSIS — O418X10 Other specified disorders of amniotic fluid and membranes, first trimester, not applicable or unspecified: Secondary | ICD-10-CM

## 2017-03-24 LAB — INFORMASEQ(SM) WITH XY ANALYSIS
FETAL FRACTION (%): 12.8
FETAL NUMBER: 1
GESTATIONAL AGE AT COLLECTION: 10.5 wk
Weight: 170 [lb_av]

## 2017-03-24 NOTE — Telephone Encounter (Signed)
Called pt, notified her of neg Informaseq results.  Cheral MarkerKimberly R. Wetzel Meester, CNM, WHNP-BC 03/24/2017 2:25 PM

## 2017-03-24 NOTE — Patient Instructions (Signed)
No sex or anything in the vagina until it has been at least 7 days from any vaginal bleeding  Subchorionic Hematoma A subchorionic hematoma is a gathering of blood between the outer wall of the placenta and the inner wall of the womb (uterus). The placenta is the organ that connects the fetus to the wall of the uterus. The placenta performs the feeding, breathing (oxygen to the fetus), and waste removal (excretory work) of the fetus. Subchorionic hematoma is the most common abnormality found on a result from ultrasonography done during the first trimester or early second trimester of pregnancy. If there has been little or no vaginal bleeding, early small hematomas usually shrink on their own and do not affect your baby or pregnancy. The blood is gradually absorbed over 1-2 weeks. When bleeding starts later in pregnancy or the hematoma is larger or occurs in an older pregnant woman, the outcome may not be as good. Larger hematomas may get bigger, which increases the chances for miscarriage. Subchorionic hematoma also increases the risk of premature detachment of the placenta from the uterus, preterm (premature) labor, and stillbirth. Follow these instructions at home:  Stay on bed rest if your health care provider recommends this. Although bed rest will not prevent more bleeding or prevent a miscarriage, your health care provider may recommend bed rest until you are advised otherwise.  Avoid heavy lifting (more than 10 lb [4.5 kg]), exercise, sexual intercourse, or douching as directed by your health care provider.  Keep track of the number of pads you use each day and how soaked (saturated) they are. Write down this information.  Do not use tampons.  Keep all follow-up appointments as directed by your health care provider. Your health care provider may ask you to have follow-up blood tests or ultrasound tests or both. Get help right away if:  You have severe cramps in your stomach, back, abdomen,  or pelvis.  You have a fever.  You pass large clots or tissue. Save any tissue for your health care provider to look at.  Your bleeding increases or you become lightheaded, feel weak, or have fainting episodes. This information is not intended to replace advice given to you by your health care provider. Make sure you discuss any questions you have with your health care provider. Document Released: 06/26/2006 Document Revised: 08/17/2015 Document Reviewed: 10/08/2012 Elsevier Interactive Patient Education  2017 [employer]

## 2017-03-24 NOTE — Telephone Encounter (Signed)
[facility] to [facility] for [facility]B and the nurse was unable to obtain FHT via doppler and a bedside U/S was unable to be performed. She is still having some spotting and mild cramping now.   Spoke with patient and informed Selena BattenKim will w/i today as soon as she can get here. Pt will be here in about 20 minutes.

## 2017-03-24 NOTE — Progress Notes (Signed)
   Work-in LOW-RISK PREGNANCY VISIT Patient name: Maria Serrano MRN [id number]  Date of birth: Dec 31, 1995 Chief Complaint:   Vaginal Bleeding (with cramping)  History of Present Illness:   Maria Serrano is a 21 y.o. G2P0010 female at 3679w2d with an Estimated Date of Delivery: 10/04/17 being seen today for ongoing management of a low-risk pregnancy.  Today she is being seen as a work-in for recent vb. Heavy bleeding started Thurs 11/27, [facility]. Went to [facility] 12/[facility] w/ SOB, CT neg for PE, they were unable to hear FHT w/doppler and did not have ultrasound available, so she was told to f/u w/ us for fetal viability. Having very small amt bleeding now, has improved a lot since Thurs, mild cramping. No recent sex.   Contractions: Irritability. Vag. Bleeding: Scant.  Movement: Absent. denies leaking of fluid. Review of Systems:   Pertinent items are noted in HPI Denies abnormal vaginal discharge w/ itching/odor/irritation, headaches, visual changes, shortness of breath, chest pain, abdominal pain, severe nausea/vomiting, or problems with urination or bowel movements unless otherwise stated above. Pertinent History Reviewed:  Reviewed past medical,surgical, social, obstetrical and family history.  Reviewed problem list, medications and allergies. Physical Assessment:   Vitals:   03/24/17 1151  BP: 118/66  Pulse: 88  Weight: 172 lb (78 kg)  Body mass index is 30.47 kg/m.        Physical Examination:   General appearance: Well appearing, and in no distress  Mental status: Alert, oriented to person, place, and time  Skin: Warm & dry  Cardiovascular: Normal heart rate noted  Respiratory: Normal respiratory effort, no distress  Abdomen: Soft, gravid, nontender  Pelvic: Cervical exam deferred         Extremities: Edema: None  Fetal Status: Fetal Heart Rate (bpm): 153 via doppler   Movement: Absent    Also did informal transabdominal u/s for pt  reassurance, +FCA w/ active fetus, pics given  No results found for this or any previous visit (from the past 24 hour(s)).  Assessment & Plan:  1) Low-risk pregnancy G2P0010 at 5979w2d with an Estimated Date of Delivery: 10/04/17   2) Recent VB, w/ known SCH, +FCA and active fetus today, Rh+, recommended continued pelvic rest, gave warning s/s, reasons to seek care   Labs/procedures today: u/s  Plan:  Continue routine obstetrical care   Reviewed: Preterm labor symptoms and general obstetric precautions including but not limited to vaginal bleeding, contractions, leaking of fluid and fetal movement were reviewed in detail with the patient.  All questions were answered  Follow-up: Return for As scheduled 1/10.  For visit and nt/it Orders Placed This Encounter  Procedures  . US Fetal Nuchal Translucency Measurement   Marge DuncansBooker, Jareli Highland Randall CNM, [facility]-BC 03/24/2017 12:41 PM

## 2017-04-03 ENCOUNTER — Encounter: Payer: Self-pay | Admitting: Women's Health

## 2017-04-03 ENCOUNTER — Encounter: Payer: BLUE CROSS/BLUE SHIELD | Admitting: Women's Health

## 2017-04-03 ENCOUNTER — Ambulatory Visit (INDEPENDENT_AMBULATORY_CARE_PROVIDER_SITE_OTHER): Payer: BLUE CROSS/BLUE SHIELD

## 2017-04-03 ENCOUNTER — Ambulatory Visit (INDEPENDENT_AMBULATORY_CARE_PROVIDER_SITE_OTHER): Payer: BLUE CROSS/BLUE SHIELD | Admitting: Women's Health

## 2017-04-03 VITALS — BP 100/60 | HR 100 | Wt 169.2 lb

## 2017-04-03 DIAGNOSIS — O418X10 Other specified disorders of amniotic fluid and membranes, first trimester, not applicable or unspecified: Secondary | ICD-10-CM | POA: Diagnosis not present

## 2017-04-03 DIAGNOSIS — Z3A13 13 weeks gestation of pregnancy: Secondary | ICD-10-CM | POA: Diagnosis not present

## 2017-04-03 DIAGNOSIS — O418X90 Other specified disorders of amniotic fluid and membranes, unspecified trimester, not applicable or unspecified: Secondary | ICD-10-CM

## 2017-04-03 DIAGNOSIS — Z3481 Encounter for supervision of other normal pregnancy, first trimester: Secondary | ICD-10-CM

## 2017-04-03 DIAGNOSIS — O468X1 Other antepartum hemorrhage, first trimester: Secondary | ICD-10-CM

## 2017-04-03 DIAGNOSIS — Z331 Pregnant state, incidental: Secondary | ICD-10-CM

## 2017-04-03 DIAGNOSIS — Z2839 Other underimmunization status: Secondary | ICD-10-CM

## 2017-04-03 DIAGNOSIS — O9989 Other specified diseases and conditions complicating pregnancy, childbirth and the puerperium: Secondary | ICD-10-CM

## 2017-04-03 DIAGNOSIS — Z363 Encounter for antenatal screening for malformations: Secondary | ICD-10-CM

## 2017-04-03 DIAGNOSIS — O09291 Supervision of pregnancy with other poor reproductive or obstetric history, first trimester: Secondary | ICD-10-CM

## 2017-04-03 DIAGNOSIS — O468X9 Other antepartum hemorrhage, unspecified trimester: Secondary | ICD-10-CM

## 2017-04-03 DIAGNOSIS — Z283 Underimmunization status: Secondary | ICD-10-CM

## 2017-04-03 DIAGNOSIS — Z3682 Encounter for antenatal screening for nuchal translucency: Secondary | ICD-10-CM | POA: Diagnosis not present

## 2017-04-03 DIAGNOSIS — Z3401 Encounter for supervision of normal first pregnancy, first trimester: Secondary | ICD-10-CM

## 2017-04-03 DIAGNOSIS — Z1389 Encounter for screening for other disorder: Secondary | ICD-10-CM

## 2017-04-03 DIAGNOSIS — O09899 Supervision of other high risk pregnancies, unspecified trimester: Secondary | ICD-10-CM

## 2017-04-03 LAB — POCT URINALYSIS DIPSTICK
Blood, UA: NEGATIVE
Glucose, UA: NEGATIVE
LEUKOCYTES UA: NEGATIVE
NITRITE UA: NEGATIVE
PROTEIN UA: NEGATIVE

## 2017-04-03 NOTE — Progress Notes (Signed)
   LOW-RISK PREGNANCY VISIT Patient name: Maria Serrano MRN [id number]  Date of birth: 1996-03-20 Chief Complaint:   Routine Prenatal Visit (ultrasound)  History of Present Illness:   Maria Serrano is a 22 y.o. G2P0010 female at 409w5d with an Estimated Date of Delivery: 10/04/17 being seen today for ongoing management of a low-risk pregnancy.  Today she reports no complaints. No vb since last visit. [facility] H/O SAB d/t T18, Informaseq this pregnancy revealed normal female.  Contractions: Not present.  .  Movement: Absent. denies leaking of fluid. Review of Systems:   Pertinent items are noted in HPI Denies abnormal vaginal discharge w/ itching/odor/irritation, headaches, visual changes, shortness of breath, chest pain, abdominal pain, severe nausea/vomiting, or problems with urination or bowel movements unless otherwise stated above. Pertinent History Reviewed:  Reviewed past medical,surgical, social, obstetrical and family history.  Reviewed problem list, medications and allergies. Physical Assessment:   Vitals:   04/03/17 1112  BP: 100/60  Pulse: 100  Weight: 169 lb 3.2 oz (76.7 kg)  Body mass index is 29.97 kg/m.        Physical Examination:   General appearance: Well appearing, and in no distress  Mental status: Alert, oriented to person, place, and time  Skin: Warm & dry  Cardiovascular: Normal heart rate noted  Respiratory: Normal respiratory effort, no distress  Abdomen: Soft, gravid, nontender  Pelvic: Cervical exam deferred         Extremities: Edema: None  Fetal Status: Fetal Heart Rate (bpm): 148 u/s   Movement: Absent    US 13+5 wks,measurements c/w dates,posterior pl gr 0,normal ovaries bilat,large subchorionic hemorrhage 12.1 x 1.6 x 8.9 cm,crl 79.85 mm,unable to obtain NT because of fetal position,NB present,fhr 148 bpm  Results for orders placed or performed in visit on 04/03/17 (from the past 24 hour(s))  POCT urinalysis dipstick   Collection Time:  04/03/17 11:20 AM  Result Value Ref Range   Color, UA     Clarity, UA     Glucose, UA neg    Bilirubin, UA     Ketones, UA small    Spec Grav, UA  1.010 - 1.025   Blood, UA neg    pH, UA  5.0 - 8.0   Protein, UA neg    Urobilinogen, UA  0.2 or 1.0 E.U./dL   Nitrite, UA neg    Leukocytes, UA Negative Negative   Appearance     Odor      Assessment & Plan:  1) Low-risk pregnancy G2P0010 at 789w5d with an Estimated Date of Delivery: 10/04/17   2) Large SCH, 12cm on today's u/s, vb has stopped, Rh+. Discussed w/ [facility], no further f/u needed  3) H/O SAB d/t T18> Informaseq neg this pregnancy, unable to obtain NT today, per [facility] get Quad/AFP in 2wks   Meds: No orders of the defined types were placed in this encounter.  Labs/procedures today: u/s  Plan:  Continue routine obstetrical care   Reviewed: Preterm labor symptoms and general obstetric precautions including but not limited to vaginal bleeding, contractions, leaking of fluid and fetal movement were reviewed in detail with the patient.  All questions were answered  Follow-up: Return for 2wks for LROB, Quad/AFP, then 4wks from now for LROB and anatomy u/s.  Orders Placed This Encounter  Procedures  . US OB Comp + 14 Wk  . POCT urinalysis dipstick   Marge DuncansBooker, Rex Magee Randall CNM, [facility]BC 04/03/2017 12:27 PM

## 2017-04-03 NOTE — Progress Notes (Signed)
US 13+5 wks,measurements c/w dates,posterior pl gr 0,normal ovaries bilat,large subchorionic hemorrhage 12.1 x 1.6 x 8.9 cm,crl 79.85 mm,unable to obtain NT because of fetal position,NB present,fhr 148 bpm

## 2017-04-03 NOTE — Patient Instructions (Signed)
Maria HickmanBrittany N Serrano, I greatly value your feedback.  If you receive a survey following your visit with us today, we appreciate you taking the time to fill it out.  Thanks, Joellyn HaffKim Dmarco Baldus, CNM, WHNP-BC   Second Trimester of Pregnancy The second trimester is from week 14 through week 27 (months 4 through 6). The second trimester is often a time when you feel your best. Your body has adjusted to being pregnant, and you begin to feel better physically. Usually, morning sickness has lessened or quit completely, you may have more energy, and you may have an increase in appetite. The second trimester is also a time when the fetus is growing rapidly. At the end of the sixth month, the fetus is about 9 inches long and weighs about 1 pounds. You will likely begin to feel the baby move (quickening) between 16 and 20 weeks of pregnancy. Body changes during your second trimester Your body continues to go through many changes during your second trimester. The changes vary from woman to woman.  Your weight will continue to increase. You will notice your lower abdomen bulging out.  You may begin to get stretch marks on your hips, abdomen, and breasts.  You may develop headaches that can be relieved by medicines. The medicines should be approved by your health care provider.  You may urinate more often because the fetus is pressing on your bladder.  You may develop or continue to have heartburn as a result of your pregnancy.  You may develop constipation because certain hormones are causing the muscles that push waste through your intestines to slow down.  You may develop hemorrhoids or swollen, bulging veins (varicose veins).  You may have back pain. This is caused by: ? Weight gain. ? Pregnancy hormones that are relaxing the joints in your pelvis. ? A shift in weight and the muscles that support your balance.  Your breasts will continue to grow and they will continue to become tender.  Your gums may bleed  and may be sensitive to brushing and flossing.  Dark spots or blotches (chloasma, mask of pregnancy) may develop on your face. This will likely fade after the baby is born.  A dark line from your belly button to the pubic area (linea nigra) may appear. This will likely fade after the baby is born.  You may have changes in your hair. These can include thickening of your hair, rapid growth, and changes in texture. Some women also have hair loss during or after pregnancy, or hair that feels dry or thin. Your hair will most likely return to normal after your baby is born.  What to expect at prenatal visits During a routine prenatal visit:  You will be weighed to make sure you and the fetus are growing normally.  Your blood pressure will be taken.  Your abdomen will be measured to track your baby's growth.  The fetal heartbeat will be listened to.  Any test results from the previous visit will be discussed.  Your health care provider may ask you:  How you are feeling.  If you are feeling the baby move.  If you have had any abnormal symptoms, such as leaking fluid, bleeding, severe headaches, or abdominal cramping.  If you are using any tobacco products, including cigarettes, chewing tobacco, and electronic cigarettes.  If you have any questions.  Other tests that may be performed during your second trimester include:  Blood tests that check for: ? Low iron levels (anemia). ? High  blood sugar that affects pregnant women (gestational diabetes) between 3 and 28 weeks. ? Rh antibodies. This is to check for a protein on red blood cells (Rh factor).  Urine tests to check for infections, diabetes, or protein in the urine.  An ultrasound to confirm the proper growth and development of the baby.  An amniocentesis to check for possible genetic problems.  Fetal screens for spina bifida and Down syndrome.  HIV (human immunodeficiency virus) testing. Routine prenatal testing includes  screening for HIV, unless you choose not to have this test.  Follow these instructions at home: Medicines  Follow your health care provider's instructions regarding medicine use. Specific medicines may be either safe or unsafe to take during pregnancy.  Take a prenatal vitamin that contains at least 600 micrograms (mcg) of folic acid.  If you develop constipation, try taking a stool softener if your health care provider approves. Eating and drinking  Eat a balanced diet that includes fresh fruits and vegetables, whole grains, good sources of protein such as meat, eggs, or tofu, and low-fat dairy. Your health care provider will help you determine the amount of weight gain that is right for you.  Avoid raw meat and uncooked cheese. These carry germs that can cause birth defects in the baby.  If you have low calcium intake from food, talk to your health care provider about whether you should take a daily calcium supplement.  Limit foods that are high in fat and processed sugars, such as fried and sweet foods.  To prevent constipation: ? Drink enough fluid to keep your urine clear or pale yellow. ? Eat foods that are high in fiber, such as fresh fruits and vegetables, whole grains, and beans. Activity  Exercise only as directed by your health care provider. Most women can continue their usual exercise routine during pregnancy. Try to exercise for 30 minutes at least 5 days a week. Stop exercising if you experience uterine contractions.  Avoid heavy lifting, wear low heel shoes, and practice good posture.  A sexual relationship may be continued unless your health care provider directs you otherwise. Relieving pain and discomfort  Wear a good support bra to prevent discomfort from breast tenderness.  Take warm sitz baths to soothe any pain or discomfort caused by hemorrhoids. Use hemorrhoid cream if your health care provider approves.  Rest with your legs elevated if you have leg cramps  or low back pain.  If you develop varicose veins, wear support hose. Elevate your feet for 15 minutes, 3-4 times a day. Limit salt in your diet. Prenatal Care  Write down your questions. Take them to your prenatal visits.  Keep all your prenatal visits as told by your health care provider. This is important. Safety  Wear your seat belt at all times when driving.  Make a list of emergency phone numbers, including numbers for family, friends, the hospital, and police and fire departments. General instructions  Ask your health care provider for a referral to a local prenatal education class. Begin classes no later than the beginning of month 6 of your pregnancy.  Ask for help if you have counseling or nutritional needs during pregnancy. Your health care provider can offer advice or refer you to specialists for help with various needs.  Do not use hot tubs, steam rooms, or saunas.  Do not douche or use tampons or scented sanitary pads.  Do not cross your legs for long periods of time.  Avoid cat litter boxes and soil  used by cats. These carry germs that can cause birth defects in the baby and possibly loss of the fetus by miscarriage or stillbirth.  Avoid all smoking, herbs, alcohol, and unprescribed drugs. Chemicals in these products can affect the formation and growth of the baby.  Do not use any products that contain nicotine or tobacco, such as cigarettes and e-cigarettes. If you need help quitting, ask your health care provider.  Visit your dentist if you have not gone yet during your pregnancy. Use a soft toothbrush to brush your teeth and be gentle when you floss. Contact a health care provider if:  You have dizziness.  You have mild pelvic cramps, pelvic pressure, or nagging pain in the abdominal area.  You have persistent nausea, vomiting, or diarrhea.  You have a bad smelling vaginal discharge.  You have pain when you urinate. Get help right away if:  You have a  fever.  You are leaking fluid from your vagina.  You have spotting or bleeding from your vagina.  You have severe abdominal cramping or pain.  You have rapid weight gain or weight loss.  You have shortness of breath with chest pain.  You notice sudden or extreme swelling of your face, hands, ankles, feet, or legs.  You have not felt your baby move in over an hour.  You have severe headaches that do not go away when you take medicine.  You have vision changes. Summary  The second trimester is from week 14 through week 27 (months 4 through 6). It is also a time when the fetus is growing rapidly.  Your body goes through many changes during pregnancy. The changes vary from woman to woman.  Avoid all smoking, herbs, alcohol, and unprescribed drugs. These chemicals affect the formation and growth your baby.  Do not use any tobacco products, such as cigarettes, chewing tobacco, and e-cigarettes. If you need help quitting, ask your health care provider.  Contact your health care provider if you have any questions. Keep all prenatal visits as told by your health care provider. This is important. This information is not intended to replace advice given to you by your health care provider. Make sure you discuss any questions you have with your health care provider. Document Released: 03/05/2001 Document Revised: 08/17/2015 Document Reviewed: 05/12/2012 Elsevier Interactive Patient Education  2017 [employer]   Subchorionic Hematoma A subchorionic hematoma is a gathering of blood between the outer wall of the placenta and the inner wall of the womb (uterus). The placenta is the organ that connects the fetus to the wall of the uterus. The placenta performs the feeding, breathing (oxygen to the fetus), and waste removal (excretory work) of the fetus. Subchorionic hematoma is the most common abnormality found on a result from ultrasonography done during the first trimester or early second  trimester of pregnancy. If there has been little or no vaginal bleeding, early small hematomas usually shrink on their own and do not affect your baby or pregnancy. The blood is gradually absorbed over 1-2 weeks. When bleeding starts later in pregnancy or the hematoma is larger or occurs in an older pregnant woman, the outcome may not be as good. Larger hematomas may get bigger, which increases the chances for miscarriage. Subchorionic hematoma also increases the risk of premature detachment of the placenta from the uterus, preterm (premature) labor, and stillbirth. Follow these instructions at home:  Stay on bed rest if your health care provider recommends this. Although bed rest will not prevent  more bleeding or prevent a miscarriage, your health care provider may recommend bed rest until you are advised otherwise.  Avoid heavy lifting (more than 10 lb [4.5 kg]), exercise, sexual intercourse, or douching as directed by your health care provider.  Keep track of the number of pads you use each day and how soaked (saturated) they are. Write down this information.  Do not use tampons.  Keep all follow-up appointments as directed by your health care provider. Your health care provider may ask you to have follow-up blood tests or ultrasound tests or both. Get help right away if:  You have severe cramps in your stomach, back, abdomen, or pelvis.  You have a fever.  You pass large clots or tissue. Save any tissue for your health care provider to look at.  Your bleeding increases or you become lightheaded, feel weak, or have fainting episodes. This information is not intended to replace advice given to you by your health care provider. Make sure you discuss any questions you have with your health care provider. Document Released: 06/26/2006 Document Revised: 08/17/2015 Document Reviewed: 10/08/2012 Elsevier Interactive Patient Education  2017 [employer].

## 2017-04-07 ENCOUNTER — Telehealth: Payer: Self-pay | Admitting: Obstetrics & Gynecology

## 2017-04-07 NOTE — Telephone Encounter (Signed)
Patient called to report dark brown spotting on Saturday and today she had a gush of dark brown spotting that was heavier than a period and is still having some spotting now.  Mild cramping last night but not currently. Pt was told to report. Encouraged to push fluids and avoid intercourse.  Please advise.

## 2017-04-07 NOTE — Telephone Encounter (Signed)
Informed patient that if only spotting now and brown, nothing in vagina and push fluids. If becomes bright red/heavy/bad cramping should be seen. No further questions. Verbalized understanding.

## 2017-04-12 ENCOUNTER — Encounter (HOSPITAL_COMMUNITY): Payer: Self-pay | Admitting: *Deleted

## 2017-04-12 ENCOUNTER — Other Ambulatory Visit: Payer: Self-pay

## 2017-04-12 ENCOUNTER — Inpatient Hospital Stay (HOSPITAL_COMMUNITY)
Admission: AD | Admit: 2017-04-12 | Discharge: 2017-04-13 | Disposition: A | Discharge status: Home / Self Care | Payer: BLUE CROSS/BLUE SHIELD | Source: Ambulatory Visit | Attending: Obstetrics and Gynecology | Admitting: Obstetrics and Gynecology

## 2017-04-12 DIAGNOSIS — O468X9 Other antepartum hemorrhage, unspecified trimester: Secondary | ICD-10-CM

## 2017-04-12 DIAGNOSIS — O200 Threatened abortion: Secondary | ICD-10-CM

## 2017-04-12 DIAGNOSIS — Z880 Allergy status to penicillin: Secondary | ICD-10-CM | POA: Insufficient documentation

## 2017-04-12 DIAGNOSIS — O209 Hemorrhage in early pregnancy, unspecified: Secondary | ICD-10-CM | POA: Diagnosis not present

## 2017-04-12 DIAGNOSIS — O208 Other hemorrhage in early pregnancy: Secondary | ICD-10-CM | POA: Diagnosis not present

## 2017-04-12 DIAGNOSIS — Z3A15 15 weeks gestation of pregnancy: Secondary | ICD-10-CM | POA: Diagnosis not present

## 2017-04-12 DIAGNOSIS — O418X90 Other specified disorders of amniotic fluid and membranes, unspecified trimester, not applicable or unspecified: Secondary | ICD-10-CM

## 2017-04-12 DIAGNOSIS — O4692 Antepartum hemorrhage, unspecified, second trimester: Secondary | ICD-10-CM

## 2017-04-12 DIAGNOSIS — Z87891 Personal history of nicotine dependence: Secondary | ICD-10-CM | POA: Diagnosis not present

## 2017-04-12 LAB — URINALYSIS, ROUTINE W REFLEX MICROSCOPIC
Bilirubin Urine: NEGATIVE
Glucose, UA: NEGATIVE mg/dL
Ketones, ur: 5 mg/dL — AB
Leukocytes, UA: NEGATIVE
Nitrite: NEGATIVE
PROTEIN: NEGATIVE mg/dL
Specific Gravity, Urine: 1.005 (ref 1.005–1.030)
pH: 6 (ref 5.0–8.0)

## 2017-04-12 LAB — CBC
HCT: 31 % — ABNORMAL LOW (ref 36.0–46.0)
HEMOGLOBIN: 11.1 g/dL — AB (ref 12.0–15.0)
MCH: 33.4 pg (ref 26.0–34.0)
MCHC: 35.8 g/dL (ref 30.0–36.0)
MCV: 93.4 fL (ref 78.0–100.0)
Platelets: 187 10*3/uL (ref 150–400)
RBC: 3.32 MIL/uL — AB (ref 3.87–5.11)
RDW: 12.8 % (ref 11.5–15.5)
WBC: 8.8 10*3/uL (ref 4.0–10.5)

## 2017-04-12 NOTE — MAU Note (Signed)
Have subchorionic hemorrhage. Have been spotting since last Sat. Last couple days have had cramping and bleeding with clots. Called nurse and told to come in. Bleeding is dark red and sometimes brown

## 2017-04-13 DIAGNOSIS — O208 Other hemorrhage in early pregnancy: Secondary | ICD-10-CM | POA: Diagnosis not present

## 2017-04-13 NOTE — Progress Notes (Signed)
Lisa Leftwich-Kirby CNM in earlier to discuss test results and d/c plan. Written and verbal d/c instructions given and understanding voiced. 

## 2017-04-13 NOTE — MAU Provider Note (Signed)
Chief Complaint: Abdominal Pain and Vaginal Bleeding   None     SUBJECTIVE HPI: Maria Serrano is a 22 y.o. G2P0010 at [redacted]w[redacted]d by LMP who presents to maternity admissions reporting light vaginal bleeding continues but some bright red bleeding with small clots noted today. Pt has known large subchorionic hemorrhage.  She knows she is at risk for miscarriage and is worried about this. There is mild cramping associated with the bleeding but it is unchanged.  She has not tried any treatments. There are no other associated symptoms.  HPI  Past Medical History:  Diagnosis Date  . Medical history non-contributory    Past Surgical History:  Procedure Laterality Date  . DILATION AND CURETTAGE OF UTERUS     Social History   Socioeconomic History  . Marital status: Married    Spouse name: Not on file  . Number of children: Not on file  . Years of education: Not on file  . Highest education level: Not on file  Social Needs  . Financial resource strain: Not on file  . Food insecurity - worry: Not on file  . Food insecurity - inability: Not on file  . Transportation needs - medical: Not on file  . Transportation needs - non-medical: Not on file  Occupational History  . Not on file  Tobacco Use  . Smoking status: Former Smoker    Years: 0.50  . Smokeless tobacco: Never Used  Substance and Sexual Activity  . Alcohol use: No  . Drug use: No  . Sexual activity: Yes    Birth control/protection: None  Other Topics Concern  . Not on file  Social History Narrative  . Not on file   No current facility-administered medications on file prior to encounter.    Current Outpatient Medications on File Prior to Encounter  Medication Sig Dispense Refill  . acetaminophen (TYLENOL) 500 MG tablet Take 1,000 mg by mouth every 6 (six) hours as needed.    . Prenatal MV-Min-FA-Omega-3 (PRENATAL GUMMIES/DHA & FA PO) Take by mouth. Takes 2 daily    . promethazine (PHENERGAN) 25 MG tablet Take 1 tablet  (25 mg total) by mouth every 6 (six) hours as needed for nausea or vomiting. (Patient not taking: Reported on 03/13/2017) 30 tablet 1   Allergies  Allergen Reactions  . Amoxicillin Other (See Comments)    Childhood allergy.    ROS:  Review of Systems  Constitutional: Negative for chills, fatigue and fever.  Eyes: Negative for visual disturbance.  Respiratory: Negative for shortness of breath.   Cardiovascular: Negative for chest pain.  Gastrointestinal: Positive for abdominal pain. Negative for nausea and vomiting.  Genitourinary: Positive for pelvic pain and vaginal bleeding. Negative for difficulty urinating, dysuria, flank pain, vaginal discharge and vaginal pain.  Neurological: Negative for dizziness and headaches.  Psychiatric/Behavioral: Negative.      I have reviewed patient's Past Medical Hx, Surgical Hx, Family Hx, Social Hx, medications and allergies.   Physical Exam   Patient Vitals for the past 24 hrs:  BP Temp Pulse Resp Height Weight  04/12/17 2233 (!) 122/57 97.9 F (36.6 C) 79 18 5\' 3"  (1.6 m) 174 lb (78.9 kg)   Constitutional: Well-developed, well-nourished female in no acute distress.  Cardiovascular: normal rate Respiratory: normal effort GI: Abd soft, non-tender. Pos BS x 4 MS: Extremities nontender, no edema, normal ROM Neurologic: Alert and oriented x 4.  GU: Neg CVAT.  PELVIC EXAM: Deferred  FHT 152 by doppler  LAB RESULTS Results for  orders placed or performed during the hospital encounter of 04/12/17 (from the past 24 hour(s))  Urinalysis, Routine w reflex microscopic     Status: Abnormal   Collection Time: 04/12/17 10:40 PM  Result Value Ref Range   Color, Urine STRAW (A) YELLOW   APPearance CLEAR CLEAR   Specific Gravity, Urine 1.005 1.005 - 1.030   pH 6.0 5.0 - 8.0   Glucose, UA NEGATIVE NEGATIVE mg/dL   Hgb urine dipstick LARGE (A) NEGATIVE   Bilirubin Urine NEGATIVE NEGATIVE   Ketones, ur 5 (A) NEGATIVE mg/dL   Protein, ur NEGATIVE  NEGATIVE mg/dL   Nitrite NEGATIVE NEGATIVE   Leukocytes, UA NEGATIVE NEGATIVE   RBC / HPF 0-5 0 - 5 RBC/hpf   WBC, UA 0-5 0 - 5 WBC/hpf   Bacteria, UA RARE (A) NONE SEEN   Squamous Epithelial / LPF 0-5 (A) NONE SEEN   Mucus PRESENT   CBC     Status: Abnormal   Collection Time: 04/12/17 11:40 PM  Result Value Ref Range   WBC 8.8 4.0 - 10.5 K/uL   RBC 3.32 (L) 3.87 - 5.11 MIL/uL   Hemoglobin 11.1 (L) 12.0 - 15.0 g/dL   HCT 16.131.0 (L) 09.636.0 - 04.546.0 %   MCV 93.4 78.0 - 100.0 fL   MCH 33.4 26.0 - 34.0 pg   MCHC 35.8 30.0 - 36.0 g/dL   RDW 40.912.8 81.111.5 - 91.415.5 %   Platelets 187 150 - 400 K/uL    A/Positive/-- (05/31 1632)  IMAGING  Bedside US today reveals 15 week IUP with FHR 150s, normal appearance of amniotic fluid and palcenta.  Subchorionic hemorrhage again viewed, see previous US below.  Dg Chest 2 View  Result Date: 03/22/2017 CLINICAL DATA:  Shortness of breath EXAM: CHEST  2 VIEW COMPARISON:  None. FINDINGS: The lungs are clear without focal pneumonia, edema, pneumothorax or pleural effusion. The cardiopericardial silhouette is within normal limits for size. The visualized bony structures of the thorax are intact. IMPRESSION: No active cardiopulmonary disease. Electronically Signed   By: Kennith CenterEric  Mansell M.D.   On: 03/22/2017 12:27   Ct Angio Chest Pe W And/or Wo Contrast  Result Date: 03/22/2017 CLINICAL DATA:  Acute onset shortness of breath this morning. High pretest probability for pulmonary embolism. EXAM: CT ANGIOGRAPHY CHEST WITH CONTRAST TECHNIQUE: Multidetector CT imaging of the chest was performed using the standard protocol during bolus administration of intravenous contrast. Multiplanar CT image reconstructions and MIPs were obtained to evaluate the vascular anatomy. CONTRAST:  75mL ISOVUE-370 IOPAMIDOL (ISOVUE-370) INJECTION 76% COMPARISON:  None. FINDINGS: Cardiovascular: Satisfactory opacification of pulmonary arteries noted, and no pulmonary emboli identified. No evidence  of thoracic aortic dissection or aneurysm. Mediastinum/Nodes: No masses or pathologically enlarged lymph nodes identified. Lungs/Pleura: No pulmonary mass, infiltrate, or effusion. Upper abdomen: No acute findings. Musculoskeletal: No suspicious bone lesions identified. Review of the MIP images confirms the above findings. IMPRESSION: Negative. No evidence of pulmonary embolism or other active disease. Electronically Signed   By: Myles RosenthalJohn  Stahl M.D.   On: 03/22/2017 13:27   [place] Fetal Nuchal Translucency Measurement  Result Date: 04/09/2017 [facility] Madison HickmanBrittany N Motl is in the office for nuchal translucency sonogram as part of an integrated screen. She is a 22 y.o. year old G2P0010 with Estimated Date of Delivery: 10/04/17 by early ultrasound now at  377w5d weeks gestation. Thus far the pregnancy has been complicated by hx of trisomy 18 in prior preg,subchoronic hemorrhage. GESTATION: SINGLETON FETAL ACTIVITY:  Heart rate         148          The fetus is active. AMNIOTIC FLUID: The amniotic fluid volume is  normal PLACENTA LOCALIZATION:  posterior GRADE 0 CERVIX: Appears closed ADNEXA: The ovaries are normal. GESTATIONAL AGE AND  BIOMETRICS: Gestational criteria: Estimated Date of Delivery: 10/04/17 by early ultrasound now at [redacted]w[redacted]d Previous Scans:1    CROWN RUMP LENGTH           79.85 mm         13+6 weeks NUCHAL TRANSLUCENCY  unable to obtain NT because of fetal position                                                                    AVERAGE EGA(BY THIS SCAN):  13+6 weeks The fetal nasal bone is identified.  TECHNICIAN COMMENTS: [place] 13+5 wks,measurements c/w dates,posterior pl gr 0,normal ovaries bilat,large subchorionic hemorrhage 12.1 x 1.6 x 8.9 cm,crl 79.85 mm,unable to obtain NT because of fetal position,NB present,fhr 148 bpm Amber Flora Lipps 04/03/2017 11:15 AM Clinical Impression and recommendations: I have reviewed the sonogram results above, combined with the patient's  current clinical course, below are my impressions and any appropriate recommendations for management based on the sonographic findings. 1.  [id number] Estimated Date of Delivery: 01/08/17 by early ultrasound and confirmed by today's sonographic dating 2.  Normal fetal sonographic findings, specifically normal nuchal translucency and present fetal nasal bone Additionally the cranium, both choroid plexuses, zygomatic arch, mandible, 4 equal extremities, stomach, bladder and anterior abdomen are all noted. 3.  Large apparent subchorionic hemorrhage that does not involve the placental bed.  This places the patient at increased risk of premature rupture membranes or  Miscarriage.  The fact that the hemorrhage does not extend beneath the placenta is somewhat reassuring. Recommend routine prenatal care based on this sonogram or as clinically indicated.if there are changes in pregnancy status Tilda Burrow 04/09/2017 6:43 PM                                                   MAU Management/MDM: Bedside US with [facility] but normal FHR today.  Reassurance provided to pt. Still threatened miscarriage due to bleeding with large [facility].  Hgb stable.  Pt to f/u at [facility] as scheduled, return to MAU as needed for emergencies. Pt discharged with strict bleeding precautions.  ASSESSMENT 1. Subchorionic hematoma, antepartum, single or unspecified fetus   2. Vaginal bleeding in pregnancy, second trimester   3. Threatened miscarriage     PLAN Discharge home  Allergies as of 04/13/2017      Reactions   Amoxicillin Other (See Comments)   Childhood allergy.      Medication List    TAKE these medications   acetaminophen 500 MG tablet Commonly known as:  TYLENOL Take 1,000 mg by mouth every 6 (six) hours as needed.   PRENATAL GUMMIES/DHA & FA PO Take by mouth. Takes 2 daily   promethazine 25 MG tablet Commonly known as:  PHENERGAN Take 1 tablet (25 mg total) by mouth every 6 (six) hours  as needed for nausea or  vomiting.      Follow-up Information    [facility] Follow up.   Contact information: [address] [place] [address] [phone]          Sharen Counter Certified Nurse-Midwife 04/13/2017  12:04 AM

## 2017-04-13 NOTE — MAU Note (Signed)
Maria CounterLisa Leftwich-Kirby CNM in with portable u/s.

## 2017-04-17 ENCOUNTER — Ambulatory Visit (INDEPENDENT_AMBULATORY_CARE_PROVIDER_SITE_OTHER): Payer: BLUE CROSS/BLUE SHIELD | Admitting: Women's Health

## 2017-04-17 ENCOUNTER — Encounter: Payer: Self-pay | Admitting: Women's Health

## 2017-04-17 VITALS — BP 108/70 | HR 81 | Wt 174.5 lb

## 2017-04-17 DIAGNOSIS — O09299 Supervision of pregnancy with other poor reproductive or obstetric history, unspecified trimester: Secondary | ICD-10-CM

## 2017-04-17 DIAGNOSIS — O418X90 Other specified disorders of amniotic fluid and membranes, unspecified trimester, not applicable or unspecified: Secondary | ICD-10-CM

## 2017-04-17 DIAGNOSIS — O09292 Supervision of pregnancy with other poor reproductive or obstetric history, second trimester: Secondary | ICD-10-CM

## 2017-04-17 DIAGNOSIS — Z3482 Encounter for supervision of other normal pregnancy, second trimester: Secondary | ICD-10-CM

## 2017-04-17 DIAGNOSIS — Z331 Pregnant state, incidental: Secondary | ICD-10-CM

## 2017-04-17 DIAGNOSIS — Z3A15 15 weeks gestation of pregnancy: Secondary | ICD-10-CM

## 2017-04-17 DIAGNOSIS — O418X20 Other specified disorders of amniotic fluid and membranes, second trimester, not applicable or unspecified: Secondary | ICD-10-CM

## 2017-04-17 DIAGNOSIS — Z1389 Encounter for screening for other disorder: Secondary | ICD-10-CM

## 2017-04-17 DIAGNOSIS — O4692 Antepartum hemorrhage, unspecified, second trimester: Secondary | ICD-10-CM

## 2017-04-17 DIAGNOSIS — O468X9 Other antepartum hemorrhage, unspecified trimester: Secondary | ICD-10-CM

## 2017-04-17 DIAGNOSIS — Z363 Encounter for antenatal screening for malformations: Secondary | ICD-10-CM

## 2017-04-17 DIAGNOSIS — O468X2 Other antepartum hemorrhage, second trimester: Secondary | ICD-10-CM

## 2017-04-17 LAB — POCT URINALYSIS DIPSTICK
Glucose, UA: NEGATIVE
Ketones, UA: NEGATIVE
LEUKOCYTES UA: NEGATIVE
Nitrite, UA: NEGATIVE
Protein, UA: NEGATIVE

## 2017-04-17 NOTE — Progress Notes (Signed)
LOW-RISK PREGNANCY VISIT Patient name: Maria Serrano MRN [id number]  Date of birth: [birth year] Chief Complaint:   Routine Prenatal Visit (AFP today; vaginal bleeding)  History of Present Illness:   Maria Serrano is a 22 y.o. G2P0010 female at 9656w5d with an Estimated Date of Delivery: 10/04/17 being seen today for ongoing management of a low-risk pregnancy.  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 Has known large 12cm SCH. Rh+. [facility]. Contractions: Not present. Vag. Bleeding: Small.  Movement: Absent. denies leaking of fluid. Review of Systems:   Pertinent items are noted in HPI Denies abnormal vaginal discharge w/ itching/odor/irritation, headaches, visual changes, shortness of breath, chest pain, abdominal pain, severe nausea/vomiting, or problems with urination or bowel movements unless otherwise stated above. Pertinent History Reviewed:  Reviewed past medical,surgical, social, obstetrical and family history.  Reviewed problem list, medications and allergies. Physical Assessment:   Vitals:   04/17/17 0932  BP: 108/70  Pulse: 81  Weight: 174 lb 8 oz (79.2 kg)  Body mass index is 30.91 kg/m.        Physical Examination:   General appearance: Well appearing, and in no distress  Mental status: Alert, oriented to person, place, and time  Skin: Warm & dry  Cardiovascular: Normal heart rate noted  Respiratory: Normal respiratory effort, no distress  Abdomen: Soft, gravid, nontender  Pelvic: Cervical exam deferred         Extremities: Edema: None  Fetal Status: Fetal Heart Rate (bpm): 144   Movement: Absent    Results for orders placed or performed in visit on 04/17/17 (from the past 24 hour(s))  POCT urinalysis dipstick   Collection Time: 04/17/17  9:37 AM  Result Value Ref Range   Color, UA     Clarity, UA     Glucose, UA neg    Bilirubin, UA     Ketones, UA neg    Spec Grav, UA  1.010 - 1.025   Blood, UA 3+    pH, UA  5.0 - 8.0   Protein, UA neg    Urobilinogen, UA  0.2 or 1.0 E.U./dL   Nitrite, UA neg    Leukocytes, UA Negative Negative   Appearance     Odor      Assessment & Plan:  1) Low-risk pregnancy G2P0010 at 5856w5d with an Estimated Date of Delivery: 10/04/17   2) [facility]/ [facility] bleeding, bleeding worse when goes to work, she wants to try cutting back hours from 8/day to 4/day before having to come out of work completely. Rh+. Continue pelvic rest. Discussed warning s/s, reasons to seek care. Discussed all w/ JVF who agrees w/ poc.   3) H/O SAB d/t T18> Informaseq normal this pregnancy, getting AFP today   Meds: No orders of the defined types were placed in this encounter.  Labs/procedures today: AFP  Plan:  Continue routine obstetrical care   Reviewed: Preterm labor symptoms and general obstetric precautions including but not limited to vaginal bleeding, contractions, leaking of fluid and fetal movement were reviewed in detail with the patient.  All questions were answered  Follow-up: Return in about 3 weeks (around 05/05/2017) for LROB, WU:[id number]:Anatomy.  Orders Placed This Encounter  Procedures  . US OB Comp + 14 Wk  . AFP TETRA  . POCT  urinalysis dipstick   Marge Duncans CNM, [facility] 04/17/2017 10:18 AM

## 2017-04-17 NOTE — Patient Instructions (Signed)
Maria Serrano, I greatly value your feedback.  If you receive a survey following your visit with us today, we appreciate you taking the time to fill it out.  Thanks, Joellyn HaffKim Lindsay Straka, CNM, WHNP-BC   Second Trimester of Pregnancy The second trimester is from week 14 through week 27 (months 4 through 6). The second trimester is often a time when you feel your best. Your body has adjusted to being pregnant, and you begin to feel better physically. Usually, morning sickness has lessened or quit completely, you may have more energy, and you may have an increase in appetite. The second trimester is also a time when the fetus is growing rapidly. At the end of the sixth month, the fetus is about 9 inches long and weighs about 1 pounds. You will likely begin to feel the baby move (quickening) between 16 and 20 weeks of pregnancy. Body changes during your second trimester Your body continues to go through many changes during your second trimester. The changes vary from woman to woman.  Your weight will continue to increase. You will notice your lower abdomen bulging out.  You may begin to get stretch marks on your hips, abdomen, and breasts.  You may develop headaches that can be relieved by medicines. The medicines should be approved by your health care provider.  You may urinate more often because the fetus is pressing on your bladder.  You may develop or continue to have heartburn as a result of your pregnancy.  You may develop constipation because certain hormones are causing the muscles that push waste through your intestines to slow down.  You may develop hemorrhoids or swollen, bulging veins (varicose veins).  You may have back pain. This is caused by: ? Weight gain. ? Pregnancy hormones that are relaxing the joints in your pelvis. ? A shift in weight and the muscles that support your balance.  Your breasts will continue to grow and they will continue to become tender.  Your gums may bleed  and may be sensitive to brushing and flossing.  Dark spots or blotches (chloasma, mask of pregnancy) may develop on your face. This will likely fade after the baby is born.  A dark line from your belly button to the pubic area (linea nigra) may appear. This will likely fade after the baby is born.  You may have changes in your hair. These can include thickening of your hair, rapid growth, and changes in texture. Some women also have hair loss during or after pregnancy, or hair that feels dry or thin. Your hair will most likely return to normal after your baby is born.  What to expect at prenatal visits During a routine prenatal visit:  You will be weighed to make sure you and the fetus are growing normally.  Your blood pressure will be taken.  Your abdomen will be measured to track your baby's growth.  The fetal heartbeat will be listened to.  Any test results from the previous visit will be discussed.  Your health care provider may ask you:  How you are feeling.  If you are feeling the baby move.  If you have had any abnormal symptoms, such as leaking fluid, bleeding, severe headaches, or abdominal cramping.  If you are using any tobacco products, including cigarettes, chewing tobacco, and electronic cigarettes.  If you have any questions.  Other tests that may be performed during your second trimester include:  Blood tests that check for: ? Low iron levels (anemia). ? High  blood sugar that affects pregnant women (gestational diabetes) between 3 and 28 weeks. ? Rh antibodies. This is to check for a protein on red blood cells (Rh factor).  Urine tests to check for infections, diabetes, or protein in the urine.  An ultrasound to confirm the proper growth and development of the baby.  An amniocentesis to check for possible genetic problems.  Fetal screens for spina bifida and Down syndrome.  HIV (human immunodeficiency virus) testing. Routine prenatal testing includes  screening for HIV, unless you choose not to have this test.  Follow these instructions at home: Medicines  Follow your health care provider's instructions regarding medicine use. Specific medicines may be either safe or unsafe to take during pregnancy.  Take a prenatal vitamin that contains at least 600 micrograms (mcg) of folic acid.  If you develop constipation, try taking a stool softener if your health care provider approves. Eating and drinking  Eat a balanced diet that includes fresh fruits and vegetables, whole grains, good sources of protein such as meat, eggs, or tofu, and low-fat dairy. Your health care provider will help you determine the amount of weight gain that is right for you.  Avoid raw meat and uncooked cheese. These carry germs that can cause birth defects in the baby.  If you have low calcium intake from food, talk to your health care provider about whether you should take a daily calcium supplement.  Limit foods that are high in fat and processed sugars, such as fried and sweet foods.  To prevent constipation: ? Drink enough fluid to keep your urine clear or pale yellow. ? Eat foods that are high in fiber, such as fresh fruits and vegetables, whole grains, and beans. Activity  Exercise only as directed by your health care provider. Most women can continue their usual exercise routine during pregnancy. Try to exercise for 30 minutes at least 5 days a week. Stop exercising if you experience uterine contractions.  Avoid heavy lifting, wear low heel shoes, and practice good posture.  A sexual relationship may be continued unless your health care provider directs you otherwise. Relieving pain and discomfort  Wear a good support bra to prevent discomfort from breast tenderness.  Take warm sitz baths to soothe any pain or discomfort caused by hemorrhoids. Use hemorrhoid cream if your health care provider approves.  Rest with your legs elevated if you have leg cramps  or low back pain.  If you develop varicose veins, wear support hose. Elevate your feet for 15 minutes, 3-4 times a day. Limit salt in your diet. Prenatal Care  Write down your questions. Take them to your prenatal visits.  Keep all your prenatal visits as told by your health care provider. This is important. Safety  Wear your seat belt at all times when driving.  Make a list of emergency phone numbers, including numbers for family, friends, the hospital, and police and fire departments. General instructions  Ask your health care provider for a referral to a local prenatal education class. Begin classes no later than the beginning of month 6 of your pregnancy.  Ask for help if you have counseling or nutritional needs during pregnancy. Your health care provider can offer advice or refer you to specialists for help with various needs.  Do not use hot tubs, steam rooms, or saunas.  Do not douche or use tampons or scented sanitary pads.  Do not cross your legs for long periods of time.  Avoid cat litter boxes and soil  used by cats. These carry germs that can cause birth defects in the baby and possibly loss of the fetus by miscarriage or stillbirth.  Avoid all smoking, herbs, alcohol, and unprescribed drugs. Chemicals in these products can affect the formation and growth of the baby.  Do not use any products that contain nicotine or tobacco, such as cigarettes and e-cigarettes. If you need help quitting, ask your health care provider.  Visit your dentist if you have not gone yet during your pregnancy. Use a soft toothbrush to brush your teeth and be gentle when you floss. Contact a health care provider if:  You have dizziness.  You have mild pelvic cramps, pelvic pressure, or nagging pain in the abdominal area.  You have persistent nausea, vomiting, or diarrhea.  You have a bad smelling vaginal discharge.  You have pain when you urinate. Get help right away if:  You have a  fever.  You are leaking fluid from your vagina.  You have spotting or bleeding from your vagina.  You have severe abdominal cramping or pain.  You have rapid weight gain or weight loss.  You have shortness of breath with chest pain.  You notice sudden or extreme swelling of your face, hands, ankles, feet, or legs.  You have not felt your baby move in over an hour.  You have severe headaches that do not go away when you take medicine.  You have vision changes. Summary  The second trimester is from week 14 through week 27 (months 4 through 6). It is also a time when the fetus is growing rapidly.  Your body goes through many changes during pregnancy. The changes vary from woman to woman.  Avoid all smoking, herbs, alcohol, and unprescribed drugs. These chemicals affect the formation and growth your baby.  Do not use any tobacco products, such as cigarettes, chewing tobacco, and e-cigarettes. If you need help quitting, ask your health care provider.  Contact your health care provider if you have any questions. Keep all prenatal visits as told by your health care provider. This is important. This information is not intended to replace advice given to you by your health care provider. Make sure you discuss any questions you have with your health care provider. Document Released: 03/05/2001 Document Revised: 08/17/2015 Document Reviewed: 05/12/2012 Elsevier Interactive Patient Education  2017 [employer]    Vaginal Bleeding During Pregnancy, Second Trimester A small amount of bleeding (spotting) from the vagina is common in pregnancy. Sometimes the bleeding is normal and is not a problem, and sometimes it is a sign of something serious. Be sure to tell your doctor about any bleeding from your vagina right away. Follow these instructions at home:  Watch your condition for any changes.  Follow your doctor's instructions about how active you can be.  If you are on bed rest: ? You  may need to stay in bed and only get up to use the bathroom. ? You may be allowed to do some activities. ? If you need help, make plans for someone to help you.  Write down: ? The number of pads you use each day. ? How often you change pads. ? How soaked (saturated) your pads are.  Do not use tampons.  Do not douche.  Do not have sex or orgasms until your doctor says it is okay.  If you pass any tissue from your vagina, save the tissue so you can show it to your doctor.  Only take medicines as told  by your doctor.  Do not take aspirin because it can make you bleed.  Do not exercise, lift heavy weights, or do any activities that take a lot of energy and effort unless your doctor says it is okay.  Keep all follow-up visits as told by your doctor. Contact a doctor if:  You bleed from your vagina.  You have cramps.  You have labor pains.  You have a fever that does not go away after you take medicine. Get help right away if:  You have very bad cramps in your back or belly (abdomen).  You have contractions.  You have chills.  You pass large clots or tissue from your vagina.  You bleed more.  You feel light-headed or weak.  You pass out (faint).  You are leaking fluid or have a gush of fluid from your vagina. This information is not intended to replace advice given to you by your health care provider. Make sure you discuss any questions you have with your health care provider. Document Released: 07/26/2013 Document Revised: 08/17/2015 Document Reviewed: 11/16/2012 Elsevier Interactive Patient Education  [employer].

## 2017-04-19 LAB — AFP TETRA
DIA MOM VALUE: 1.49
DIA VALUE (EIA): 239.89 pg/mL
DSR (By Age)    1 IN: 1067
DSR (Second Trimester) 1 IN: 1458
GESTATIONAL AGE AFP: 15.5 wk
MATERNAL AGE AT EDD: 24.1 a
MSAFP Mom: 0.96
MSAFP: 25.8 ng/mL
MSHCG MOM: 1.27
MSHCG: 51420 m[IU]/mL
Osb Risk: 10000
T18 (By Age): 1:4157 {titer}
Test Results:: NEGATIVE
UE3 MOM: 0.84
UE3 VALUE: 0.54 ng/mL
Weight: 175 [lb_av]

## 2017-05-01 ENCOUNTER — Encounter: Payer: Self-pay | Admitting: Women's Health

## 2017-05-01 ENCOUNTER — Encounter: Payer: Self-pay | Admitting: Obstetrics & Gynecology

## 2017-05-01 ENCOUNTER — Ambulatory Visit (INDEPENDENT_AMBULATORY_CARE_PROVIDER_SITE_OTHER): Payer: BLUE CROSS/BLUE SHIELD | Admitting: Obstetrics & Gynecology

## 2017-05-01 ENCOUNTER — Ambulatory Visit (INDEPENDENT_AMBULATORY_CARE_PROVIDER_SITE_OTHER): Payer: BLUE CROSS/BLUE SHIELD

## 2017-05-01 VITALS — BP 114/60 | HR 88 | Wt 174.0 lb

## 2017-05-01 DIAGNOSIS — Z3A17 17 weeks gestation of pregnancy: Secondary | ICD-10-CM

## 2017-05-01 DIAGNOSIS — Z3482 Encounter for supervision of other normal pregnancy, second trimester: Secondary | ICD-10-CM

## 2017-05-01 DIAGNOSIS — Z363 Encounter for antenatal screening for malformations: Secondary | ICD-10-CM

## 2017-05-01 DIAGNOSIS — O09899 Supervision of other high risk pregnancies, unspecified trimester: Secondary | ICD-10-CM

## 2017-05-01 DIAGNOSIS — Z331 Pregnant state, incidental: Secondary | ICD-10-CM

## 2017-05-01 DIAGNOSIS — O208 Other hemorrhage in early pregnancy: Secondary | ICD-10-CM

## 2017-05-01 DIAGNOSIS — O418X20 Other specified disorders of amniotic fluid and membranes, second trimester, not applicable or unspecified: Secondary | ICD-10-CM

## 2017-05-01 DIAGNOSIS — O09299 Supervision of pregnancy with other poor reproductive or obstetric history, unspecified trimester: Secondary | ICD-10-CM

## 2017-05-01 DIAGNOSIS — Z283 Underimmunization status: Secondary | ICD-10-CM

## 2017-05-01 DIAGNOSIS — O468X2 Other antepartum hemorrhage, second trimester: Secondary | ICD-10-CM

## 2017-05-01 DIAGNOSIS — O09292 Supervision of pregnancy with other poor reproductive or obstetric history, second trimester: Secondary | ICD-10-CM

## 2017-05-01 DIAGNOSIS — O4450 Low lying placenta with hemorrhage, unspecified trimester: Secondary | ICD-10-CM | POA: Insufficient documentation

## 2017-05-01 DIAGNOSIS — Z3481 Encounter for supervision of other normal pregnancy, first trimester: Secondary | ICD-10-CM

## 2017-05-01 DIAGNOSIS — Z1389 Encounter for screening for other disorder: Secondary | ICD-10-CM

## 2017-05-01 DIAGNOSIS — O9989 Other specified diseases and conditions complicating pregnancy, childbirth and the puerperium: Secondary | ICD-10-CM

## 2017-05-01 DIAGNOSIS — Z2839 Other underimmunization status: Secondary | ICD-10-CM

## 2017-05-01 LAB — POCT URINALYSIS DIPSTICK
Blood, UA: NEGATIVE
GLUCOSE UA: NEGATIVE
KETONES UA: NEGATIVE
Leukocytes, UA: NEGATIVE
Nitrite, UA: NEGATIVE
Protein, UA: NEGATIVE

## 2017-05-01 NOTE — Progress Notes (Signed)
G2P0010 7148w5d Estimated Date of Delivery: 10/04/17  Blood pressure 114/60, pulse 88, weight 174 lb (78.9 kg), last menstrual period 12/19/2016, unknown if currently breastfeeding.   BP weight and urine results all reviewed and noted.  Please refer to the obstetrical flow sheet for the fundal height and fetal heart rate documentation:  Patient reports good fetal movement, denies any bleeding and no rupture of membranes symptoms or regular contractions. Patient is without complaints. All questions were answered.  Orders Placed This Encounter  Procedures  . POCT Urinalysis Dipstick    Plan:  Continued routine obstetrical care, sonogram is normal Pt denies bleeding, just some brownish spotting Work hour limitations lifted  Recheck sonogram 8 weeks at PN2 visit  Return in about 4 weeks (around 05/29/2017) for LROB.

## 2017-05-01 NOTE — Progress Notes (Signed)
US 17+5 wks,cephalic,low lying posterior pl gr 0,tip of pl to cx 2 cm,subchorionic hemorrhage 13 x 7.4 x 1.3 cm,svp of fluid 4.7 cm,fhr 148 bpm,efw 209 g,limited lateral view of feet

## 2017-05-13 ENCOUNTER — Telehealth: Payer: Self-pay | Admitting: Women's Health

## 2017-05-13 NOTE — Telephone Encounter (Signed)
Patient states she has had a fever yesterday and today "did not check temp but had chills", scratchy sore throat and a dry cough for about 3 days. She has only taken Tylenol since she was unsure of what else she could take. Informed patient she could try Sudafed, Mucinex, Cold-Eeze, cough drops and to push fluids. Advised symptoms may get worse before getting better but to let us know if she is not feeling better after 5-7 days from start of symptoms. Pt verbalized understanding with no further questions.

## 2017-05-13 NOTE — Telephone Encounter (Signed)
Patient called stating that she is [redacted] weeks pregnant and she has been with a cold for a little and would like to know what over the counter medication she can take. Please contact pt

## 2017-05-29 ENCOUNTER — Ambulatory Visit (INDEPENDENT_AMBULATORY_CARE_PROVIDER_SITE_OTHER): Payer: BLUE CROSS/BLUE SHIELD | Admitting: Women's Health

## 2017-05-29 ENCOUNTER — Encounter: Payer: Self-pay | Admitting: Women's Health

## 2017-05-29 VITALS — BP 90/60 | HR 98 | Wt 176.6 lb

## 2017-05-29 DIAGNOSIS — Z1389 Encounter for screening for other disorder: Secondary | ICD-10-CM

## 2017-05-29 DIAGNOSIS — O4442 Low lying placenta NOS or without hemorrhage, second trimester: Secondary | ICD-10-CM

## 2017-05-29 DIAGNOSIS — Z331 Pregnant state, incidental: Secondary | ICD-10-CM

## 2017-05-29 DIAGNOSIS — O4692 Antepartum hemorrhage, unspecified, second trimester: Secondary | ICD-10-CM

## 2017-05-29 DIAGNOSIS — Z3482 Encounter for supervision of other normal pregnancy, second trimester: Secondary | ICD-10-CM

## 2017-05-29 DIAGNOSIS — Z3A21 21 weeks gestation of pregnancy: Secondary | ICD-10-CM

## 2017-05-29 LAB — POCT URINALYSIS DIPSTICK
GLUCOSE UA: NEGATIVE
Ketones, UA: NEGATIVE
LEUKOCYTES UA: NEGATIVE
NITRITE UA: NEGATIVE
PROTEIN UA: NEGATIVE
RBC UA: NEGATIVE

## 2017-05-29 NOTE — Patient Instructions (Signed)
Madison HickmanBrittany N Sciara, I greatly value your feedback.  If you receive a survey following your visit with Maria Serrano today, we appreciate you taking the time to fill it out.  Thanks, Joellyn HaffKim Marybella Ethier, CNM, WHNP-BC   Second Trimester of Pregnancy The second trimester is from week 14 through week 27 (months 4 through 6). The second trimester is often a time when you feel your best. Your body has adjusted to being pregnant, and you begin to feel better physically. Usually, morning sickness has lessened or quit completely, you may have more energy, and you may have an increase in appetite. The second trimester is also a time when the fetus is growing rapidly. At the end of the sixth month, the fetus is about 9 inches long and weighs about 1 pounds. You will likely begin to feel the baby move (quickening) between 16 and 20 weeks of pregnancy. Body changes during your second trimester Your body continues to go through many changes during your second trimester. The changes vary from woman to woman.  Your weight will continue to increase. You will notice your lower abdomen bulging out.  You may begin to get stretch marks on your hips, abdomen, and breasts.  You may develop headaches that can be relieved by medicines. The medicines should be approved by your health care provider.  You may urinate more often because the fetus is pressing on your bladder.  You may develop or continue to have heartburn as a result of your pregnancy.  You may develop constipation because certain hormones are causing the muscles that push waste through your intestines to slow down.  You may develop hemorrhoids or swollen, bulging veins (varicose veins).  You may have back pain. This is caused by: ? Weight gain. ? Pregnancy hormones that are relaxing the joints in your pelvis. ? A shift in weight and the muscles that support your balance.  Your breasts will continue to grow and they will continue to become tender.  Your gums may bleed  and may be sensitive to brushing and flossing.  Dark spots or blotches (chloasma, mask of pregnancy) may develop on your face. This will likely fade after the baby is born.  A dark line from your belly button to the pubic area (linea nigra) may appear. This will likely fade after the baby is born.  You may have changes in your hair. These can include thickening of your hair, rapid growth, and changes in texture. Some women also have hair loss during or after pregnancy, or hair that feels dry or thin. Your hair will most likely return to normal after your baby is born.  What to expect at prenatal visits During a routine prenatal visit:  You will be weighed to make sure you and the fetus are growing normally.  Your blood pressure will be taken.  Your abdomen will be measured to track your baby's growth.  The fetal heartbeat will be listened to.  Any test results from the previous visit will be discussed.  Your health care provider may ask you:  How you are feeling.  If you are feeling the baby move.  If you have had any abnormal symptoms, such as leaking fluid, bleeding, severe headaches, or abdominal cramping.  If you are using any tobacco products, including cigarettes, chewing tobacco, and electronic cigarettes.  If you have any questions.  Other tests that may be performed during your second trimester include:  Blood tests that check for: ? Low iron levels (anemia). ? High  blood sugar that affects pregnant women (gestational diabetes) between 3 and 28 weeks. ? Rh antibodies. This is to check for a protein on red blood cells (Rh factor).  Urine tests to check for infections, diabetes, or protein in the urine.  An ultrasound to confirm the proper growth and development of the baby.  An amniocentesis to check for possible genetic problems.  Fetal screens for spina bifida and Down syndrome.  HIV (human immunodeficiency virus) testing. Routine prenatal testing includes  screening for HIV, unless you choose not to have this test.  Follow these instructions at home: Medicines  Follow your health care provider's instructions regarding medicine use. Specific medicines may be either safe or unsafe to take during pregnancy.  Take a prenatal vitamin that contains at least 600 micrograms (mcg) of folic acid.  If you develop constipation, try taking a stool softener if your health care provider approves. Eating and drinking  Eat a balanced diet that includes fresh fruits and vegetables, whole grains, good sources of protein such as meat, eggs, or tofu, and low-fat dairy. Your health care provider will help you determine the amount of weight gain that is right for you.  Avoid raw meat and uncooked cheese. These carry germs that can cause birth defects in the baby.  If you have low calcium intake from food, talk to your health care provider about whether you should take a daily calcium supplement.  Limit foods that are high in fat and processed sugars, such as fried and sweet foods.  To prevent constipation: ? Drink enough fluid to keep your urine clear or pale yellow. ? Eat foods that are high in fiber, such as fresh fruits and vegetables, whole grains, and beans. Activity  Exercise only as directed by your health care provider. Most women can continue their usual exercise routine during pregnancy. Try to exercise for 30 minutes at least 5 days a week. Stop exercising if you experience uterine contractions.  Avoid heavy lifting, wear low heel shoes, and practice good posture.  A sexual relationship may be continued unless your health care provider directs you otherwise. Relieving pain and discomfort  Wear a good support bra to prevent discomfort from breast tenderness.  Take warm sitz baths to soothe any pain or discomfort caused by hemorrhoids. Use hemorrhoid cream if your health care provider approves.  Rest with your legs elevated if you have leg cramps  or low back pain.  If you develop varicose veins, wear support hose. Elevate your feet for 15 minutes, 3-4 times a day. Limit salt in your diet. Prenatal Care  Write down your questions. Take them to your prenatal visits.  Keep all your prenatal visits as told by your health care provider. This is important. Safety  Wear your seat belt at all times when driving.  Make a list of emergency phone numbers, including numbers for family, friends, the hospital, and police and fire departments. General instructions  Ask your health care provider for a referral to a local prenatal education class. Begin classes no later than the beginning of month 6 of your pregnancy.  Ask for help if you have counseling or nutritional needs during pregnancy. Your health care provider can offer advice or refer you to specialists for help with various needs.  Do not use hot tubs, steam rooms, or saunas.  Do not douche or use tampons or scented sanitary pads.  Do not cross your legs for long periods of time.  Avoid cat litter boxes and soil  used by cats. These carry germs that can cause birth defects in the baby and possibly loss of the fetus by miscarriage or stillbirth.  Avoid all smoking, herbs, alcohol, and unprescribed drugs. Chemicals in these products can affect the formation and growth of the baby.  Do not use any products that contain nicotine or tobacco, such as cigarettes and e-cigarettes. If you need help quitting, ask your health care provider.  Visit your dentist if you have not gone yet during your pregnancy. Use a soft toothbrush to brush your teeth and be gentle when you floss. Contact a health care provider if:  You have dizziness.  You have mild pelvic cramps, pelvic pressure, or nagging pain in the abdominal area.  You have persistent nausea, vomiting, or diarrhea.  You have a bad smelling vaginal discharge.  You have pain when you urinate. Get help right away if:  You have a  fever.  You are leaking fluid from your vagina.  You have spotting or bleeding from your vagina.  You have severe abdominal cramping or pain.  You have rapid weight gain or weight loss.  You have shortness of breath with chest pain.  You notice sudden or extreme swelling of your face, hands, ankles, feet, or legs.  You have not felt your baby move in over an hour.  You have severe headaches that do not go away when you take medicine.  You have vision changes. Summary  The second trimester is from week 14 through week 27 (months 4 through 6). It is also a time when the fetus is growing rapidly.  Your body goes through many changes during pregnancy. The changes vary from woman to woman.  Avoid all smoking, herbs, alcohol, and unprescribed drugs. These chemicals affect the formation and growth your baby.  Do not use any tobacco products, such as cigarettes, chewing tobacco, and e-cigarettes. If you need help quitting, ask your health care provider.  Contact your health care provider if you have any questions. Keep all prenatal visits as told by your health care provider. This is important. This information is not intended to replace advice given to you by your health care provider. Make sure you discuss any questions you have with your health care provider. Document Released: 03/05/2001 Document Revised: 08/17/2015 Document Reviewed: 05/12/2012 Elsevier Interactive Patient Education  2017 [employer].

## 2017-05-29 NOTE — Progress Notes (Signed)
   LOW-RISK PREGNANCY VISIT Patient name: Maria Serrano Arechiga MRN [id number]  Date of birth: [birth year] Chief Complaint:   Routine Prenatal Visit  History of Present Illness:   Maria Serrano Rufer is a 22 y.o. G2P0010 female at 545w5d with an Estimated Date of Delivery: 10/04/17 being seen today for ongoing management of a low-risk pregnancy.  Today she reports no complaints. Large SCH and LLP, no bleeding in 2wks! Contractions: Not present.  .  Movement: Present. denies leaking of fluid. Review of Systems:   Pertinent items are noted in HPI Denies abnormal vaginal discharge w/ itching/odor/irritation, headaches, visual changes, shortness of breath, chest pain, abdominal pain, severe nausea/vomiting, or problems with urination or bowel movements unless otherwise stated above. Pertinent History Reviewed:  Reviewed past medical,surgical, social, obstetrical and family history.  Reviewed problem list, medications and allergies. Physical Assessment:   Vitals:   05/29/17 0844  BP: 90/60  Pulse: 98  Weight: 176 lb 9.6 oz (80.1 kg)  Body mass index is 31.28 kg/m.        Physical Examination:   General appearance: Well appearing, and in no distress  Mental status: Alert, oriented to person, place, and time  Skin: Warm & dry  Cardiovascular: Normal heart rate noted  Respiratory: Normal respiratory effort, no distress  Abdomen: Soft, gravid, nontender  Pelvic: Cervical exam deferred         Extremities: Edema: None  Fetal Status: Fetal Heart Rate (bpm): 137   Movement: Present    Results for orders placed or performed in visit on 05/29/17 (from the past 24 hour(s))  POCT urinalysis dipstick   Collection Time: 05/29/17  8:49 AM  Result Value Ref Range   Color, UA     Clarity, UA     Glucose, UA neg    Bilirubin, UA     Ketones, UA neg    Spec Grav, UA  1.010 - 1.025   Blood, UA neg    pH, UA  5.0 - 8.0   Protein, UA neg    Urobilinogen, UA  0.2 or 1.0 E.U./dL   Nitrite, UA neg    Leukocytes, UA Negative Negative   Appearance     Odor      Assessment & Plan:  1) Low-risk pregnancy G2P0010 at 525w5d with an Estimated Date of Delivery: 10/04/17   2) Large SCH and LLP, continued pelvic rest, recheck @ 28wks   Meds: No orders of the defined types were placed in this encounter.  Labs/procedures today: none  Plan:  Continue routine obstetrical care   Reviewed: Preterm labor symptoms and general obstetric precautions including but not limited to vaginal bleeding, contractions, leaking of fluid and fetal movement were reviewed in detail with the patient.  All questions were answered  Follow-up: Return in about 4 weeks (around 06/26/2017) for LROB.  Orders Placed This Encounter  Procedures  . POCT urinalysis dipstick   Cheral MarkerKimberly R Tasheba Henson CNM, [facility] 05/29/2017 9:07 AM

## 2017-06-26 ENCOUNTER — Ambulatory Visit (INDEPENDENT_AMBULATORY_CARE_PROVIDER_SITE_OTHER): Payer: BLUE CROSS/BLUE SHIELD | Admitting: Advanced Practice Midwife

## 2017-06-26 ENCOUNTER — Encounter: Payer: Self-pay | Admitting: Advanced Practice Midwife

## 2017-06-26 VITALS — BP 118/64 | HR 88 | Wt 183.0 lb

## 2017-06-26 DIAGNOSIS — Z0489 Encounter for examination and observation for other specified reasons: Secondary | ICD-10-CM

## 2017-06-26 DIAGNOSIS — Z331 Pregnant state, incidental: Secondary | ICD-10-CM

## 2017-06-26 DIAGNOSIS — Z1389 Encounter for screening for other disorder: Secondary | ICD-10-CM

## 2017-06-26 DIAGNOSIS — K219 Gastro-esophageal reflux disease without esophagitis: Secondary | ICD-10-CM

## 2017-06-26 DIAGNOSIS — O4450 Low lying placenta with hemorrhage, unspecified trimester: Secondary | ICD-10-CM

## 2017-06-26 DIAGNOSIS — O468X9 Other antepartum hemorrhage, unspecified trimester: Secondary | ICD-10-CM

## 2017-06-26 DIAGNOSIS — O418X90 Other specified disorders of amniotic fluid and membranes, unspecified trimester, not applicable or unspecified: Secondary | ICD-10-CM

## 2017-06-26 DIAGNOSIS — O4452 Low lying placenta with hemorrhage, second trimester: Secondary | ICD-10-CM

## 2017-06-26 DIAGNOSIS — O99612 Diseases of the digestive system complicating pregnancy, second trimester: Secondary | ICD-10-CM

## 2017-06-26 DIAGNOSIS — IMO0002 Reserved for concepts with insufficient information to code with codable children: Secondary | ICD-10-CM

## 2017-06-26 DIAGNOSIS — Z3A25 25 weeks gestation of pregnancy: Secondary | ICD-10-CM

## 2017-06-26 DIAGNOSIS — O468X2 Other antepartum hemorrhage, second trimester: Secondary | ICD-10-CM

## 2017-06-26 LAB — POCT URINALYSIS DIPSTICK
Blood, UA: NEGATIVE
GLUCOSE UA: NEGATIVE
KETONES UA: NEGATIVE
Leukocytes, UA: NEGATIVE
Nitrite, UA: NEGATIVE
PROTEIN UA: NEGATIVE

## 2017-06-26 MED ORDER — OMEPRAZOLE 20 MG PO CPDR: 20.0000 mg | DELAYED_RELEASE_CAPSULE | Freq: Every day | ORAL | 6 refills | Status: DC

## 2017-06-26 NOTE — Progress Notes (Signed)
  G2P0010 7052w5d Estimated Date of Delivery: 10/04/17  Blood pressure 118/64, pulse 88, weight 183 lb (83 kg), last menstrual period 12/19/2016, unknown if currently breastfeeding.   BP weight and urine results all reviewed and noted.  Please refer to the obstetrical flow sheet for the fundal height and fetal heart rate documentation:  Patient reports good fetal movement, denies any bleeding and no rupture of membranes symptoms or regular contractions. Patient has reflux.  Rx prilosec All questions were answered.   Physical Assessment:   Vitals:   06/26/17 0844  BP: 118/64  Pulse: 88  Weight: 183 lb (83 kg)  Body mass index is 32.42 kg/m.        Physical Examination:   General appearance: Well appearing, and in no distress  Mental status: Alert, oriented to person, place, and time  Skin: Warm & dry  Cardiovascular: Normal heart rate noted  Respiratory: Normal respiratory effort, no distress  Abdomen: Soft, gravid, nontender  Pelvic: Cervical exam deferred         Extremities: Edema: None  Fetal Status: Fetal Heart Rate (bpm): 129 Fundal Height: 27 cm Movement: Present    Results for orders placed or performed in visit on 06/26/17 (from the past 24 hour(s))  POCT urinalysis dipstick   Collection Time: 06/26/17  8:45 AM  Result Value Ref Range   Color, UA     Clarity, UA     Glucose, UA neg    Bilirubin, UA     Ketones, UA neg    Spec Grav, UA  1.010 - 1.025   Blood, UA neg    pH, UA  5.0 - 8.0   Protein, UA neg    Urobilinogen, UA  0.2 or 1.0 E.U./dL   Nitrite, UA neg    Leukocytes, UA Negative Negative   Appearance     Odor       Orders Placed This Encounter  Procedures  . US OB Follow Up  . POCT urinalysis dipstick    Large SCH and LLP, continued pelvic rest, recheck @ 28wks    Plan:  Continued routine obstetrical care,   Return in about 2 weeks (around 07/10/2017) for PN2/LROB, US:OB F/U:.

## 2017-06-26 NOTE — Patient Instructions (Signed)

## 2017-07-10 ENCOUNTER — Encounter: Payer: Self-pay | Admitting: Advanced Practice Midwife

## 2017-07-10 ENCOUNTER — Ambulatory Visit (INDEPENDENT_AMBULATORY_CARE_PROVIDER_SITE_OTHER): Payer: BLUE CROSS/BLUE SHIELD

## 2017-07-10 ENCOUNTER — Other Ambulatory Visit: Payer: BLUE CROSS/BLUE SHIELD

## 2017-07-10 ENCOUNTER — Ambulatory Visit (INDEPENDENT_AMBULATORY_CARE_PROVIDER_SITE_OTHER): Payer: BLUE CROSS/BLUE SHIELD | Admitting: Advanced Practice Midwife

## 2017-07-10 VITALS — BP 100/60 | HR 98 | Wt 186.0 lb

## 2017-07-10 DIAGNOSIS — Z3482 Encounter for supervision of other normal pregnancy, second trimester: Secondary | ICD-10-CM

## 2017-07-10 DIAGNOSIS — O468X9 Other antepartum hemorrhage, unspecified trimester: Secondary | ICD-10-CM

## 2017-07-10 DIAGNOSIS — Z3A27 27 weeks gestation of pregnancy: Secondary | ICD-10-CM | POA: Diagnosis not present

## 2017-07-10 DIAGNOSIS — O4450 Low lying placenta with hemorrhage, unspecified trimester: Secondary | ICD-10-CM

## 2017-07-10 DIAGNOSIS — O09899 Supervision of other high risk pregnancies, unspecified trimester: Secondary | ICD-10-CM

## 2017-07-10 DIAGNOSIS — Z131 Encounter for screening for diabetes mellitus: Secondary | ICD-10-CM

## 2017-07-10 DIAGNOSIS — O418X20 Other specified disorders of amniotic fluid and membranes, second trimester, not applicable or unspecified: Secondary | ICD-10-CM

## 2017-07-10 DIAGNOSIS — Z331 Pregnant state, incidental: Secondary | ICD-10-CM

## 2017-07-10 DIAGNOSIS — Z283 Underimmunization status: Secondary | ICD-10-CM

## 2017-07-10 DIAGNOSIS — Z3402 Encounter for supervision of normal first pregnancy, second trimester: Secondary | ICD-10-CM

## 2017-07-10 DIAGNOSIS — O4452 Low lying placenta with hemorrhage, second trimester: Secondary | ICD-10-CM

## 2017-07-10 DIAGNOSIS — Z0489 Encounter for examination and observation for other specified reasons: Secondary | ICD-10-CM

## 2017-07-10 DIAGNOSIS — O418X90 Other specified disorders of amniotic fluid and membranes, unspecified trimester, not applicable or unspecified: Secondary | ICD-10-CM

## 2017-07-10 DIAGNOSIS — O9989 Other specified diseases and conditions complicating pregnancy, childbirth and the puerperium: Secondary | ICD-10-CM

## 2017-07-10 DIAGNOSIS — IMO0002 Reserved for concepts with insufficient information to code with codable children: Secondary | ICD-10-CM

## 2017-07-10 DIAGNOSIS — Z2839 Other underimmunization status: Secondary | ICD-10-CM

## 2017-07-10 DIAGNOSIS — Z1389 Encounter for screening for other disorder: Secondary | ICD-10-CM

## 2017-07-10 LAB — POCT URINALYSIS DIPSTICK
GLUCOSE UA: NEGATIVE
KETONES UA: NEGATIVE
Leukocytes, UA: NEGATIVE
Nitrite, UA: NEGATIVE
Protein, UA: NEGATIVE
RBC UA: NEGATIVE

## 2017-07-10 NOTE — Patient Instructions (Signed)
Maria Serrano, I greatly value your feedback.  If you receive a survey following your visit with us today, we appreciate you taking the time to fill it out.  Thanks, Cathie BeamsFran Cresenzo-Dishmon, CNM   Call the office [phone]) or go to [facility] if:  You begin to have strong, frequent contractions  Your water breaks.  Sometimes it is a big gush of fluid, sometimes it is just a trickle that keeps getting your panties wet or running down your legs  You have vaginal bleeding.  It is normal to have a small amount of spotting if your cervix was checked.   You don't feel your baby moving like normal.  If you don't, get you something to eat and drink and lay down and focus on feeling your baby move.  You should feel at least 10 movements in 2 hours.  If you don't, you should call the office or go to [facility].    Tdap Vaccine  It is recommended that you get the Tdap vaccine during the third trimester of EACH pregnancy to help protect your baby from getting pertussis (whooping cough)  27-36 weeks is the BEST time to do this so that you can pass the protection on to your baby. During pregnancy is better than after pregnancy, but if you are unable to get it during pregnancy it will be offered at the hospital.   You can get this vaccine at the health department or your family doctor  Everyone who will be around your baby should also be up-to-date on their vaccines. Adults (who are not pregnant) only need 1 dose of Tdap during adulthood.   Third Trimester of Pregnancy The third trimester is from week 29 through week 42, months 7 through 9. The third trimester is a time when the fetus is growing rapidly. At the end of the ninth month, the fetus is about 20 inches in length and weighs 6-10 pounds.  BODY CHANGES Your body goes through many changes during pregnancy. The changes vary from woman to woman.   Your weight will continue to increase. You can expect to gain 25-35 pounds (11-16 kg)  by the end of the pregnancy.  You may begin to get stretch marks on your hips, abdomen, and breasts.  You may urinate more often because the fetus is moving lower into your pelvis and pressing on your bladder.  You may develop or continue to have heartburn as a result of your pregnancy.  You may develop constipation because certain hormones are causing the muscles that push waste through your intestines to slow down.  You may develop hemorrhoids or swollen, bulging veins (varicose veins).  You may have pelvic pain because of the weight gain and pregnancy hormones relaxing your joints between the bones in your pelvis. Backaches may result from overexertion of the muscles supporting your posture.  You may have changes in your hair. These can include thickening of your hair, rapid growth, and changes in texture. Some women also have hair loss during or after pregnancy, or hair that feels dry or thin. Your hair will most likely return to normal after your baby is born.  Your breasts will continue to grow and be tender. A yellow discharge may leak from your breasts called colostrum.  Your belly button may stick out.  You may feel short of breath because of your expanding uterus.  You may notice the fetus "dropping," or moving lower in your abdomen.  You may have a bloody mucus  discharge. This usually occurs a few days to a week before labor begins.  Your cervix becomes thin and soft (effaced) near your due date. WHAT TO EXPECT AT YOUR PRENATAL EXAMS  You will have prenatal exams every 2 weeks until week 36. Then, you will have weekly prenatal exams. During a routine prenatal visit:  You will be weighed to make sure you and the fetus are growing normally.  Your blood pressure is taken.  Your abdomen will be measured to track your baby's growth.  The fetal heartbeat will be listened to.  Any test results from the previous visit will be discussed.  You may have a cervical check near  your due date to see if you have effaced. At around 36 weeks, your caregiver will check your cervix. At the same time, your caregiver will also perform a test on the secretions of the vaginal tissue. This test is to determine if a type of bacteria, Group B streptococcus, is present. Your caregiver will explain this further. Your caregiver may ask you:  What your birth plan is.  How you are feeling.  If you are feeling the baby move.  If you have had any abnormal symptoms, such as leaking fluid, bleeding, severe headaches, or abdominal cramping.  If you have any questions. Other tests or screenings that may be performed during your third trimester include:  Blood tests that check for low iron levels (anemia).  Fetal testing to check the health, activity level, and growth of the fetus. Testing is done if you have certain medical conditions or if there are problems during the pregnancy. FALSE LABOR You may feel small, irregular contractions that eventually go away. These are called Braxton Hicks contractions, or false labor. Contractions may last for hours, days, or even weeks before true labor sets in. If contractions come at regular intervals, intensify, or become painful, it is best to be seen by your caregiver.  SIGNS OF LABOR   Menstrual-like cramps.  Contractions that are 5 minutes apart or less.  Contractions that start on the top of the uterus and spread down to the lower abdomen and back.  A sense of increased pelvic pressure or back pain.  A watery or bloody mucus discharge that comes from the vagina. If you have any of these signs before the 37th week of pregnancy, call your caregiver right away. You need to go to the hospital to get checked immediately. HOME CARE INSTRUCTIONS   Avoid all smoking, herbs, alcohol, and unprescribed drugs. These chemicals affect the formation and growth of the baby.  Follow your caregiver's instructions regarding medicine use. There are  medicines that are either safe or unsafe to take during pregnancy.  Exercise only as directed by your caregiver. Experiencing uterine cramps is a good sign to stop exercising.  Continue to eat regular, healthy meals.  Wear a good support bra for breast tenderness.  Do not use hot tubs, steam rooms, or saunas.  Wear your seat belt at all times when driving.  Avoid raw meat, uncooked cheese, cat litter boxes, and soil used by cats. These carry germs that can cause birth defects in the baby.  Take your prenatal vitamins.  Try taking a stool softener (if your caregiver approves) if you develop constipation. Eat more high-fiber foods, such as fresh vegetables or fruit and whole grains. Drink plenty of fluids to keep your urine clear or pale yellow.  Take warm sitz baths to soothe any pain or discomfort caused by hemorrhoids. Use  hemorrhoid cream if your caregiver approves.  If you develop varicose veins, wear support hose. Elevate your feet for 15 minutes, 3-4 times a day. Limit salt in your diet.  Avoid heavy lifting, wear low heal shoes, and practice good posture.  Rest a lot with your legs elevated if you have leg cramps or low back pain.  Visit your dentist if you have not gone during your pregnancy. Use a soft toothbrush to brush your teeth and be gentle when you floss.  A sexual relationship may be continued unless your caregiver directs you otherwise.  Do not travel far distances unless it is absolutely necessary and only with the approval of your caregiver.  Take prenatal classes to understand, practice, and ask questions about the labor and delivery.  Make a trial run to the hospital.  Pack your hospital bag.  Prepare the baby's nursery.  Continue to go to all your prenatal visits as directed by your caregiver. SEEK MEDICAL CARE IF:  You are unsure if you are in labor or if your water has broken.  You have dizziness.  You have mild pelvic cramps, pelvic pressure, or  nagging pain in your abdominal area.  You have persistent nausea, vomiting, or diarrhea.  You have a bad smelling vaginal discharge.  You have pain with urination. SEEK IMMEDIATE MEDICAL CARE IF:   You have a fever.  You are leaking fluid from your vagina.  You have spotting or bleeding from your vagina.  You have severe abdominal cramping or pain.  You have rapid weight loss or gain.  You have shortness of breath with chest pain.  You notice sudden or extreme swelling of your face, hands, ankles, feet, or legs.  You have not felt your baby move in over an hour.  You have severe headaches that do not go away with medicine.  You have vision changes. Document Released: 03/05/2001 Document Revised: 03/16/2013 Document Reviewed: 05/12/2012 [facility] Patient Information 2015 [place], [place]. This information is not intended to replace advice given to you by your health care provider. Make sure you discuss any questions you have with your health care provider.

## 2017-07-10 NOTE — Progress Notes (Signed)
US 27+5 wks,cephalic, posterior pl gr 0,resolved low lying placenta and subchorionic hemorrhage,normal ovaries bilat,fhr 129 bpm,afi 18 cm,efw 1367 g 90%

## 2017-07-10 NOTE — Progress Notes (Signed)
  G2P0010 2833w5d Estimated Date of Delivery: 10/04/17  Blood pressure 100/60, pulse 98, weight 186 lb (84.4 kg), last menstrual period 12/19/2016, unknown if currently breastfeeding.   BP weight and urine results all reviewed and noted.  Please refer to the obstetrical flow sheet for the fundal height and fetal heart rate documentation:  Patient reports good fetal movement, denies any bleeding and no rupture of membranes symptoms or regular contractions. Patient is without complaints. All questions were answered.   Physical Assessment:   Vitals:   07/10/17 0947  BP: 100/60  Pulse: 98  Weight: 186 lb (84.4 kg)  Body mass index is 32.95 kg/m.        Physical Examination:   General appearance: Well appearing, and in no distress  Mental status: Alert, oriented to person, place, and time  Skin: Warm & dry  Cardiovascular: Normal heart rate noted  Respiratory: Normal respiratory effort, no distress  Abdomen: Soft, gravid, nontender  Pelvic: Cervical exam deferred         Extremities: Edema: None  Fetal Status: Fetal Heart Rate (bpm): 129   Movement: Present   US 27+5 wks,cephalic, posterior pl gr 0,resolved low lying placenta and subchorionic hemorrhage,normal ovaries bilat,fhr 129 bpm,afi 18 cm,efw 1367 g 90%    Results for orders placed or performed in visit on 07/10/17 (from the past 24 hour(s))  POCT urinalysis dipstick   Collection Time: 07/10/17  9:48 AM  Result Value Ref Range   Color, UA     Clarity, UA     Glucose, UA neg    Bilirubin, UA     Ketones, UA neg    Spec Grav, UA  1.010 - 1.025   Blood, UA neg    pH, UA  5.0 - 8.0   Protein, UA neg    Urobilinogen, UA  0.2 or 1.0 E.U./dL   Nitrite, UA neg    Leukocytes, UA Negative Negative   Appearance     Odor       Orders Placed This Encounter  Procedures  . POCT urinalysis dipstick    Plan:  Continued routine obstetrical care,   Return in about 3 weeks (around 07/31/2017) for LROB.

## 2017-07-11 LAB — CBC
HEMOGLOBIN: 11.9 g/dL (ref 11.1–15.9)
Hematocrit: 35.7 % (ref 34.0–46.6)
MCH: 31.7 pg (ref 26.6–33.0)
MCHC: 33.3 g/dL (ref 31.5–35.7)
MCV: 95 fL (ref 79–97)
Platelets: 218 10*3/uL (ref 150–379)
RBC: 3.75 x10E6/uL — AB (ref 3.77–5.28)
RDW: 12.6 % (ref 12.3–15.4)
WBC: 7.9 10*3/uL (ref 3.4–10.8)

## 2017-07-11 LAB — RPR: RPR: NONREACTIVE

## 2017-07-11 LAB — HIV ANTIBODY (ROUTINE TESTING W REFLEX): HIV SCREEN 4TH GENERATION: NONREACTIVE

## 2017-07-11 LAB — GLUCOSE TOLERANCE, 2 HOURS W/ 1HR
GLUCOSE, FASTING: 80 mg/dL (ref 65–91)
Glucose, 1 hour: 151 mg/dL (ref 65–179)
Glucose, 2 hour: 104 mg/dL (ref 65–152)

## 2017-07-11 LAB — ANTIBODY SCREEN: Antibody Screen: NEGATIVE

## 2017-07-31 ENCOUNTER — Ambulatory Visit (INDEPENDENT_AMBULATORY_CARE_PROVIDER_SITE_OTHER): Payer: BLUE CROSS/BLUE SHIELD | Admitting: Women's Health

## 2017-07-31 ENCOUNTER — Encounter: Payer: Self-pay | Admitting: Women's Health

## 2017-07-31 VITALS — BP 110/60 | HR 92 | Wt 190.0 lb

## 2017-07-31 DIAGNOSIS — Z331 Pregnant state, incidental: Secondary | ICD-10-CM

## 2017-07-31 DIAGNOSIS — Z3483 Encounter for supervision of other normal pregnancy, third trimester: Secondary | ICD-10-CM

## 2017-07-31 DIAGNOSIS — Z3A30 30 weeks gestation of pregnancy: Secondary | ICD-10-CM

## 2017-07-31 DIAGNOSIS — Z1389 Encounter for screening for other disorder: Secondary | ICD-10-CM

## 2017-07-31 LAB — POCT URINALYSIS DIPSTICK
GLUCOSE UA: NEGATIVE
Ketones, UA: NEGATIVE
LEUKOCYTES UA: NEGATIVE
NITRITE UA: NEGATIVE
PROTEIN UA: NEGATIVE
RBC UA: NEGATIVE

## 2017-07-31 NOTE — Patient Instructions (Signed)
Maria Serrano, I greatly value your feedback.  If you receive a survey following your visit with [place] today, we appreciate you taking the time to fill it out.  Thanks, Joellyn Haff, CNM, WHNP-BC   Call the office [phone]) or go to [facility] if:  You begin to have strong, frequent contractions  Your water breaks.  Sometimes it is a big gush of fluid, sometimes it is just a trickle that keeps getting your panties wet or running down your legs  You have vaginal bleeding.  It is normal to have a small amount of spotting if your cervix was checked.   You don't feel your baby moving like normal.  If you don't, get you something to eat and drink and lay down and focus on feeling your baby move.  You should feel at least 10 movements in 2 hours.  If you don't, you should call the office or go to [facility].    Tdap Vaccine  It is recommended that you get the Tdap vaccine during the third trimester of EACH pregnancy to help protect your baby from getting pertussis (whooping cough)  27-36 weeks is the BEST time to do this so that you can pass the protection on to your baby. During pregnancy is better than after pregnancy, but if you are unable to get it during pregnancy it will be offered at the hospital.   You can get this vaccine at the health department or your family doctor  Everyone who will be around your baby should also be up-to-date on their vaccines. Adults (who are not pregnant) only need 1 dose of Tdap during adulthood.   Third Trimester of Pregnancy The third trimester is from week 29 through week 42, months 7 through 9. The third trimester is a time when the fetus is growing rapidly. At the end of the ninth month, the fetus is about 20 inches in length and weighs 6-10 pounds.  BODY CHANGES Your body goes through many changes during pregnancy. The changes vary from woman to woman.   Your weight will continue to increase. You can expect to gain 25-35 pounds (11-16 kg)  by the end of the pregnancy.  You may begin to get stretch marks on your hips, abdomen, and breasts.  You may urinate more often because the fetus is moving lower into your pelvis and pressing on your bladder.  You may develop or continue to have heartburn as a result of your pregnancy.  You may develop constipation because certain hormones are causing the muscles that push waste through your intestines to slow down.  You may develop hemorrhoids or swollen, bulging veins (varicose veins).  You may have pelvic pain because of the weight gain and pregnancy hormones relaxing your joints between the bones in your pelvis. Backaches may result from overexertion of the muscles supporting your posture.  You may have changes in your hair. These can include thickening of your hair, rapid growth, and changes in texture. Some women also have hair loss during or after pregnancy, or hair that feels dry or thin. Your hair will most likely return to normal after your baby is born.  Your breasts will continue to grow and be tender. A yellow discharge may leak from your breasts called colostrum.  Your belly button may stick out.  You may feel short of breath because of your expanding uterus.  You may notice the fetus "dropping," or moving lower in your abdomen.  You may have a bloody  mucus discharge. This usually occurs a few days to a week before labor begins.  Your cervix becomes thin and soft (effaced) near your due date. WHAT TO EXPECT AT YOUR PRENATAL EXAMS  You will have prenatal exams every 2 weeks until week 36. Then, you will have weekly prenatal exams. During a routine prenatal visit:  You will be weighed to make sure you and the fetus are growing normally.  Your blood pressure is taken.  Your abdomen will be measured to track your baby's growth.  The fetal heartbeat will be listened to.  Any test results from the previous visit will be discussed.  You may have a cervical check near  your due date to see if you have effaced. At around 36 weeks, your caregiver will check your cervix. At the same time, your caregiver will also perform a test on the secretions of the vaginal tissue. This test is to determine if a type of bacteria, Group B streptococcus, is present. Your caregiver will explain this further. Your caregiver may ask you:  What your birth plan is.  How you are feeling.  If you are feeling the baby move.  If you have had any abnormal symptoms, such as leaking fluid, bleeding, severe headaches, or abdominal cramping.  If you have any questions. Other tests or screenings that may be performed during your third trimester include:  Blood tests that check for low iron levels (anemia).  Fetal testing to check the health, activity level, and growth of the fetus. Testing is done if you have certain medical conditions or if there are problems during the pregnancy. FALSE LABOR You may feel small, irregular contractions that eventually go away. These are called Braxton Hicks contractions, or false labor. Contractions may last for hours, days, or even weeks before true labor sets in. If contractions come at regular intervals, intensify, or become painful, it is best to be seen by your caregiver.  SIGNS OF LABOR   Menstrual-like cramps.  Contractions that are 5 minutes apart or less.  Contractions that start on the top of the uterus and spread down to the lower abdomen and back.  A sense of increased pelvic pressure or back pain.  A watery or bloody mucus discharge that comes from the vagina. If you have any of these signs before the 37th week of pregnancy, call your caregiver right away. You need to go to the hospital to get checked immediately. HOME CARE INSTRUCTIONS   Avoid all smoking, herbs, alcohol, and unprescribed drugs. These chemicals affect the formation and growth of the baby.  Follow your caregiver's instructions regarding medicine use. There are  medicines that are either safe or unsafe to take during pregnancy.  Exercise only as directed by your caregiver. Experiencing uterine cramps is a good sign to stop exercising.  Continue to eat regular, healthy meals.  Wear a good support bra for breast tenderness.  Do not use hot tubs, steam rooms, or saunas.  Wear your seat belt at all times when driving.  Avoid raw meat, uncooked cheese, cat litter boxes, and soil used by cats. These carry germs that can cause birth defects in the baby.  Take your prenatal vitamins.  Try taking a stool softener (if your caregiver approves) if you develop constipation. Eat more high-fiber foods, such as fresh vegetables or fruit and whole grains. Drink plenty of fluids to keep your urine clear or pale yellow.  Take warm sitz baths to soothe any pain or discomfort caused by hemorrhoids.  Use hemorrhoid cream if your caregiver approves.  If you develop varicose veins, wear support hose. Elevate your feet for 15 minutes, 3-4 times a day. Limit salt in your diet.  Avoid heavy lifting, wear low heal shoes, and practice good posture.  Rest a lot with your legs elevated if you have leg cramps or low back pain.  Visit your dentist if you have not gone during your pregnancy. Use a soft toothbrush to brush your teeth and be gentle when you floss.  A sexual relationship may be continued unless your caregiver directs you otherwise.  Do not travel far distances unless it is absolutely necessary and only with the approval of your caregiver.  Take prenatal classes to understand, practice, and ask questions about the labor and delivery.  Make a trial run to the hospital.  Pack your hospital bag.  Prepare the baby's nursery.  Continue to go to all your prenatal visits as directed by your caregiver. SEEK MEDICAL CARE IF:  You are unsure if you are in labor or if your water has broken.  You have dizziness.  You have mild pelvic cramps, pelvic pressure, or  nagging pain in your abdominal area.  You have persistent nausea, vomiting, or diarrhea.  You have a bad smelling vaginal discharge.  You have pain with urination. SEEK IMMEDIATE MEDICAL CARE IF:   You have a fever.  You are leaking fluid from your vagina.  You have spotting or bleeding from your vagina.  You have severe abdominal cramping or pain.  You have rapid weight loss or gain.  You have shortness of breath with chest pain.  You notice sudden or extreme swelling of your face, hands, ankles, feet, or legs.  You have not felt your baby move in over an hour.  You have severe headaches that do not go away with medicine.  You have vision changes. Document Released: 03/05/2001 Document Revised: 03/16/2013 Document Reviewed: 05/12/2012 [facility] Patient Information 2015 [place], [place]. This information is not intended to replace advice given to you by your health care provider. Make sure you discuss any questions you have with your health care provider.

## 2017-07-31 NOTE — Progress Notes (Signed)
   LOW-RISK PREGNANCY VISIT Patient name: Maria Serrano MRN [id number]  Date of birth: 01-17-1996 Chief Complaint:   Routine Prenatal Visit  History of Present Illness:   Maria Serrano is a 22 y.o. G2P0010 female at [redacted]w[redacted]d with an Estimated Date of Delivery: 10/04/17 being seen today for ongoing management of a low-risk pregnancy.  Today she reports no complaints. Contractions: Irregular. Vag. Bleeding: None.  Movement: Present. denies leaking of fluid. Review of Systems:   Pertinent items are noted in HPI Denies abnormal vaginal discharge w/ itching/odor/irritation, headaches, visual changes, shortness of breath, chest pain, abdominal pain, severe nausea/vomiting, or problems with urination or bowel movements unless otherwise stated above. Pertinent History Reviewed:  Reviewed past medical,surgical, social, obstetrical and family history.  Reviewed problem list, medications and allergies. Physical Assessment:   Vitals:   07/31/17 1603  BP: 110/60  Pulse: 92  Weight: 190 lb (86.2 kg)  Body mass index is 33.66 kg/m.        Physical Examination:   General appearance: Well appearing, and in no distress  Mental status: Alert, oriented to person, place, and time  Skin: Warm & dry  Cardiovascular: Normal heart rate noted  Respiratory: Normal respiratory effort, no distress  Abdomen: Soft, gravid, nontender  Pelvic: Cervical exam deferred         Extremities: Edema: Trace  Fetal Status: Fetal Heart Rate (bpm): 135 Fundal Height: 30 cm Movement: Present    Results for orders placed or performed in visit on 07/31/17 (from the past 24 hour(s))  POCT urinalysis dipstick   Collection Time: 07/31/17  4:04 PM  Result Value Ref Range   Color, UA     Clarity, UA     Glucose, UA neg    Bilirubin, UA     Ketones, UA neg    Spec Grav, UA  1.010 - 1.025   Blood, UA neg    pH, UA  5.0 - 8.0   Protein, UA neg    Urobilinogen, UA  0.2 or 1.0 E.U./dL   Nitrite, UA neg    Leukocytes,  UA Negative Negative   Appearance     Odor      Assessment & Plan:  1) Low-risk pregnancy G2P0010 at [redacted]w[redacted]d with an Estimated Date of Delivery: 10/04/17    Meds: No orders of the defined types were placed in this encounter.  Labs/procedures today: none  Plan:  Continue routine obstetrical care   Reviewed: Preterm labor symptoms and general obstetric precautions including but not limited to vaginal bleeding, contractions, leaking of fluid and fetal movement were reviewed in detail with the patient. Recommended Tdap at HD/PCP per CDC guidelines.  All questions were answered  Follow-up: Return in about 2 weeks (around 08/14/2017) for LROB.  Orders Placed This Encounter  Procedures  . POCT urinalysis dipstick   Cheral Marker CNM, [facility] 07/31/2017 4:29 PM

## 2017-08-14 ENCOUNTER — Encounter: Payer: Self-pay | Admitting: Advanced Practice Midwife

## 2017-08-14 ENCOUNTER — Ambulatory Visit (INDEPENDENT_AMBULATORY_CARE_PROVIDER_SITE_OTHER): Payer: BLUE CROSS/BLUE SHIELD | Admitting: Advanced Practice Midwife

## 2017-08-14 VITALS — BP 100/60 | HR 98 | Wt 193.3 lb

## 2017-08-14 DIAGNOSIS — Z3483 Encounter for supervision of other normal pregnancy, third trimester: Secondary | ICD-10-CM

## 2017-08-14 DIAGNOSIS — Z3A32 32 weeks gestation of pregnancy: Secondary | ICD-10-CM | POA: Diagnosis not present

## 2017-08-14 DIAGNOSIS — Z23 Encounter for immunization: Secondary | ICD-10-CM

## 2017-08-14 DIAGNOSIS — Z331 Pregnant state, incidental: Secondary | ICD-10-CM

## 2017-08-14 DIAGNOSIS — Z1389 Encounter for screening for other disorder: Secondary | ICD-10-CM

## 2017-08-14 LAB — POCT URINALYSIS DIPSTICK
Blood, UA: NEGATIVE
Glucose, UA: NEGATIVE
KETONES UA: NEGATIVE
Leukocytes, UA: NEGATIVE
Nitrite, UA: NEGATIVE
Protein, UA: NEGATIVE

## 2017-08-14 NOTE — Progress Notes (Signed)
  G2P0010 [redacted]w[redacted]d Estimated Date of Delivery: 10/04/17  Blood pressure 100/60, pulse 98, weight 193 lb 4.8 oz (87.7 kg), last menstrual period 12/19/2016, unknown if currently breastfeeding.   BP weight and urine results all reviewed and noted.  Please refer to the obstetrical flow sheet for the fundal height and fetal heart rate documentation:  Patient reports good fetal movement, denies any bleeding and no rupture of membranes symptoms or regular contractions. Patient is without complaints. Maternity belt is helping w/sciatica All questions were answered.   Physical Assessment:   Vitals:   08/14/17 0836  BP: 100/60  Pulse: 98  Weight: 193 lb 4.8 oz (87.7 kg)  Body mass index is 34.24 kg/m.        Physical Examination:   General appearance: Well appearing, and in no distress  Mental status: Alert, oriented to person, place, and time  Skin: Warm & dry  Cardiovascular: Normal heart rate noted  Respiratory: Normal respiratory effort, no distress  Abdomen: Soft, gravid, nontender  Pelvic: Cervical exam deferred         Extremities: Edema: None  Fetal Status:     Movement: Present    Results for orders placed or performed in visit on 08/14/17 (from the past 24 hour(s))  POCT urinalysis dipstick   Collection Time: 08/14/17  8:44 AM  Result Value Ref Range   Color, UA     Clarity, UA     Glucose, UA Negative Negative   Bilirubin, UA     Ketones, UA neg    Spec Grav, UA  1.010 - 1.025   Blood, UA neg    pH, UA  5.0 - 8.0   Protein, UA Negative Negative   Urobilinogen, UA  0.2 or 1.0 E.U./dL   Nitrite, UA neg    Leukocytes, UA Negative Negative   Appearance     Odor       Orders Placed This Encounter  Procedures  . POCT urinalysis dipstick    Plan:  Continued routine obstetrical care, TDAP today  Return in about 2 weeks (around 08/28/2017) for LROB.

## 2017-08-14 NOTE — Addendum Note (Signed)
Addended by: Federico Flake A on: 08/14/2017 09:33 AM   Modules accepted: Orders

## 2017-08-28 ENCOUNTER — Other Ambulatory Visit: Payer: Self-pay

## 2017-08-28 ENCOUNTER — Encounter: Payer: Self-pay | Admitting: Advanced Practice Midwife

## 2017-08-28 ENCOUNTER — Ambulatory Visit (INDEPENDENT_AMBULATORY_CARE_PROVIDER_SITE_OTHER): Payer: BLUE CROSS/BLUE SHIELD | Admitting: Advanced Practice Midwife

## 2017-08-28 VITALS — BP 109/62 | HR 109 | Wt 198.0 lb

## 2017-08-28 DIAGNOSIS — O26843 Uterine size-date discrepancy, third trimester: Secondary | ICD-10-CM

## 2017-08-28 DIAGNOSIS — Z3483 Encounter for supervision of other normal pregnancy, third trimester: Secondary | ICD-10-CM

## 2017-08-28 DIAGNOSIS — Z331 Pregnant state, incidental: Secondary | ICD-10-CM

## 2017-08-28 DIAGNOSIS — Z3A34 34 weeks gestation of pregnancy: Secondary | ICD-10-CM

## 2017-08-28 DIAGNOSIS — Z1389 Encounter for screening for other disorder: Secondary | ICD-10-CM

## 2017-08-28 LAB — POCT URINALYSIS DIPSTICK
Blood, UA: NEGATIVE
Glucose, UA: NEGATIVE
KETONES UA: NEGATIVE
Nitrite, UA: NEGATIVE
Protein, UA: NEGATIVE

## 2017-08-28 NOTE — Progress Notes (Signed)
  G2P0010 1563w5d Estimated Date of Delivery: 10/04/17  Blood pressure 109/62, pulse (!) 109, weight 198 lb (89.8 kg), last menstrual period 12/19/2016, unknown if currently breastfeeding.   BP weight and urine results all reviewed and noted.  Please refer to the obstetrical flow sheet for the fundal height and fetal heart rate documentation:  Patient reports good fetal movement, denies any bleeding and no rupture of membranes symptoms or regular contractions. Patient is without complaints. All questions were answered.    Physical Assessment:   Vitals:   08/28/17 0911  BP: 109/62  Pulse: (!) 109  Weight: 198 lb (89.8 kg)  Body mass index is 35.07 kg/m.        Physical Examination:   General appearance: Well appearing, and in no distress  Mental status: Alert, oriented to person, place, and time  Skin: Warm & dry  Cardiovascular: Normal heart rate noted  Respiratory: Normal respiratory effort, no distress  Abdomen: Soft, gravid, nontender  Pelvic: Cervical exam deferred         Extremities: Edema: None  Fetal Status: Fetal Heart Rate (bpm): 136 Fundal Height: 36 cm Movement: Present    Results for orders placed or performed in visit on 08/28/17 (from the past 24 hour(s))  POCT urinalysis dipstick   Collection Time: 08/28/17  9:12 AM  Result Value Ref Range   Color, UA     Clarity, UA     Glucose, UA Negative Negative   Bilirubin, UA     Ketones, UA neg    Spec Grav, UA  1.010 - 1.025   Blood, UA neg    pH, UA  5.0 - 8.0   Protein, UA Negative Negative   Urobilinogen, UA  0.2 or 1.0 E.U./dL   Nitrite, UA neg    Leukocytes, UA Moderate (2+) (A) Negative   Appearance     Odor       Orders Placed This Encounter  Procedures  . US OB Follow Up  . POCT urinalysis dipstick    Plan:  Continued routine obstetrical care,   Return in about 2 weeks (around 09/11/2017) for LROB, US:EFW; order paragard IUD.

## 2017-09-10 ENCOUNTER — Inpatient Hospital Stay (HOSPITAL_COMMUNITY)
Admission: AD | Admit: 2017-09-10 | Discharge: 2017-09-13 | DRG: 786 | Disposition: A | Discharge status: Home / Self Care | Payer: BLUE CROSS/BLUE SHIELD | Attending: Obstetrics & Gynecology | Admitting: Obstetrics & Gynecology

## 2017-09-10 ENCOUNTER — Encounter (HOSPITAL_COMMUNITY)
Admission: AD | Disposition: A | Discharge status: Home / Self Care | Payer: Self-pay | Source: Home / Self Care | Attending: Obstetrics & Gynecology

## 2017-09-10 ENCOUNTER — Other Ambulatory Visit: Payer: Self-pay

## 2017-09-10 ENCOUNTER — Ambulatory Visit (INDEPENDENT_AMBULATORY_CARE_PROVIDER_SITE_OTHER): Payer: BLUE CROSS/BLUE SHIELD | Admitting: Advanced Practice Midwife

## 2017-09-10 ENCOUNTER — Inpatient Hospital Stay (HOSPITAL_COMMUNITY): Payer: BLUE CROSS/BLUE SHIELD | Admitting: Anesthesiology

## 2017-09-10 ENCOUNTER — Telehealth: Payer: Self-pay | Admitting: Obstetrics & Gynecology

## 2017-09-10 ENCOUNTER — Ambulatory Visit (INDEPENDENT_AMBULATORY_CARE_PROVIDER_SITE_OTHER): Payer: BLUE CROSS/BLUE SHIELD

## 2017-09-10 ENCOUNTER — Encounter: Payer: Self-pay | Admitting: Advanced Practice Midwife

## 2017-09-10 ENCOUNTER — Telehealth (HOSPITAL_COMMUNITY): Payer: Self-pay | Admitting: *Deleted

## 2017-09-10 ENCOUNTER — Encounter (HOSPITAL_COMMUNITY): Payer: Self-pay

## 2017-09-10 VITALS — BP 116/64 | HR 95 | Wt 201.0 lb

## 2017-09-10 DIAGNOSIS — Z3A36 36 weeks gestation of pregnancy: Secondary | ICD-10-CM

## 2017-09-10 DIAGNOSIS — E669 Obesity, unspecified: Secondary | ICD-10-CM | POA: Diagnosis not present

## 2017-09-10 DIAGNOSIS — Z1389 Encounter for screening for other disorder: Secondary | ICD-10-CM

## 2017-09-10 DIAGNOSIS — O42913 Preterm premature rupture of membranes, unspecified as to length of time between rupture and onset of labor, third trimester: Secondary | ICD-10-CM | POA: Diagnosis present

## 2017-09-10 DIAGNOSIS — Z331 Pregnant state, incidental: Secondary | ICD-10-CM

## 2017-09-10 DIAGNOSIS — O321XX0 Maternal care for breech presentation, not applicable or unspecified: Principal | ICD-10-CM | POA: Diagnosis present

## 2017-09-10 DIAGNOSIS — Z87891 Personal history of nicotine dependence: Secondary | ICD-10-CM | POA: Diagnosis not present

## 2017-09-10 DIAGNOSIS — O9989 Other specified diseases and conditions complicating pregnancy, childbirth and the puerperium: Principal | ICD-10-CM

## 2017-09-10 DIAGNOSIS — O26843 Uterine size-date discrepancy, third trimester: Secondary | ICD-10-CM | POA: Diagnosis not present

## 2017-09-10 DIAGNOSIS — Z3403 Encounter for supervision of normal first pregnancy, third trimester: Secondary | ICD-10-CM

## 2017-09-10 DIAGNOSIS — O99214 Obesity complicating childbirth: Secondary | ICD-10-CM | POA: Diagnosis not present

## 2017-09-10 DIAGNOSIS — Z3483 Encounter for supervision of other normal pregnancy, third trimester: Secondary | ICD-10-CM

## 2017-09-10 DIAGNOSIS — Z283 Underimmunization status: Secondary | ICD-10-CM

## 2017-09-10 DIAGNOSIS — Z2839 Other underimmunization status: Secondary | ICD-10-CM

## 2017-09-10 DIAGNOSIS — Z831 Family history of other infectious and parasitic diseases: Secondary | ICD-10-CM | POA: Diagnosis not present

## 2017-09-10 DIAGNOSIS — O4202 Full-term premature rupture of membranes, onset of labor within 24 hours of rupture: Secondary | ICD-10-CM | POA: Diagnosis not present

## 2017-09-10 DIAGNOSIS — Q6689 Other  specified congenital deformities of feet: Secondary | ICD-10-CM | POA: Diagnosis not present

## 2017-09-10 DIAGNOSIS — Z98891 History of uterine scar from previous surgery: Secondary | ICD-10-CM

## 2017-09-10 DIAGNOSIS — O4200 Premature rupture of membranes, onset of labor within 24 hours of rupture, unspecified weeks of gestation: Secondary | ICD-10-CM

## 2017-09-10 DIAGNOSIS — Z23 Encounter for immunization: Secondary | ICD-10-CM | POA: Diagnosis not present

## 2017-09-10 LAB — POCT URINALYSIS DIPSTICK
Blood, UA: NEGATIVE
Glucose, UA: NEGATIVE
KETONES UA: NEGATIVE
Leukocytes, UA: NEGATIVE
Nitrite, UA: NEGATIVE
Protein, UA: NEGATIVE

## 2017-09-10 LAB — CBC
HCT: 33.3 % — ABNORMAL LOW (ref 36.0–46.0)
Hemoglobin: 10.9 g/dL — ABNORMAL LOW (ref 12.0–15.0)
MCH: 29.3 pg (ref 26.0–34.0)
MCHC: 32.7 g/dL (ref 30.0–36.0)
MCV: 89.5 fL (ref 78.0–100.0)
PLATELETS: 229 10*3/uL (ref 150–400)
RBC: 3.72 MIL/uL — ABNORMAL LOW (ref 3.87–5.11)
RDW: 13.3 % (ref 11.5–15.5)
WBC: 11.4 10*3/uL — AB (ref 4.0–10.5)

## 2017-09-10 LAB — TYPE AND SCREEN
ABO/RH(D): A POS
ANTIBODY SCREEN: NEGATIVE

## 2017-09-10 LAB — POCT FERN TEST: POCT FERN TEST: POSITIVE

## 2017-09-10 LAB — ABO/RH: ABO/RH(D): A POS

## 2017-09-10 SURGERY — Surgical Case
Anesthesia: Spinal | Wound class: Clean Contaminated

## 2017-09-10 MED ORDER — FENTANYL CITRATE (PF) 100 MCG/2ML IJ SOLN: 25.0000 ug | INTRAMUSCULAR | Status: DC | PRN

## 2017-09-10 MED ORDER — BETAMETHASONE SOD PHOS & ACET 6 (3-3) MG/ML IJ SUSP
12.0000 mg | Freq: Once | INTRAMUSCULAR | Status: AC
  Administered 2017-09-10: 12 mg via INTRAMUSCULAR
  Filled 2017-09-10: qty 2

## 2017-09-10 MED ORDER — CEFAZOLIN SODIUM-DEXTROSE 2-3 GM-%(50ML) IV SOLR
INTRAVENOUS | Status: DC | PRN
  Administered 2017-09-10: 2 g via INTRAVENOUS

## 2017-09-10 MED ORDER — SODIUM CHLORIDE 0.9 % IR SOLN
Status: DC | PRN
  Administered 2017-09-10: 1

## 2017-09-10 MED ORDER — PHENYLEPHRINE HCL 10 MG/ML IJ SOLN
INTRAVENOUS | Status: DC | PRN
  Administered 2017-09-10: 60 ug/min via INTRAVENOUS

## 2017-09-10 MED ORDER — ACETAMINOPHEN 325 MG PO TABS
650.0000 mg | ORAL_TABLET | ORAL | Status: DC | PRN
  Administered 2017-09-11 – 2017-09-13 (×4): 650 mg via ORAL
  Filled 2017-09-10 (×4): qty 2

## 2017-09-10 MED ORDER — SOD CITRATE-CITRIC ACID 500-334 MG/5ML PO SOLN
30.0000 mL | ORAL | Status: AC
  Administered 2017-09-10: 30 mL via ORAL
  Filled 2017-09-10: qty 15

## 2017-09-10 MED ORDER — ENOXAPARIN SODIUM 60 MG/0.6ML ~~LOC~~ SOLN
50.0000 mg | SUBCUTANEOUS | Status: DC
  Administered 2017-09-11 – 2017-09-13 (×3): 50 mg via SUBCUTANEOUS
  Filled 2017-09-10 (×4): qty 0.6

## 2017-09-10 MED ORDER — SODIUM CHLORIDE 0.9 % IV SOLN
500.0000 mg | INTRAVENOUS | Status: AC
  Administered 2017-09-10: 19:00:00 via INTRAVENOUS
  Filled 2017-09-10: qty 500

## 2017-09-10 MED ORDER — DIBUCAINE 1 % RE OINT: 1.0000 "application " | TOPICAL_OINTMENT | RECTAL | Status: DC | PRN

## 2017-09-10 MED ORDER — DIPHENHYDRAMINE HCL 50 MG/ML IJ SOLN
INTRAMUSCULAR | Status: AC
  Filled 2017-09-10: qty 1

## 2017-09-10 MED ORDER — OXYCODONE HCL 5 MG PO TABS
5.0000 mg | ORAL_TABLET | ORAL | Status: DC | PRN
  Administered 2017-09-11: 5 mg via ORAL
  Filled 2017-09-10: qty 1

## 2017-09-10 MED ORDER — DEXAMETHASONE SODIUM PHOSPHATE 10 MG/ML IJ SOLN
INTRAMUSCULAR | Status: DC | PRN
  Administered 2017-09-10: 4 mg via INTRAVENOUS

## 2017-09-10 MED ORDER — SODIUM CHLORIDE 0.9% FLUSH: 3.0000 mL | INTRAVENOUS | Status: DC | PRN

## 2017-09-10 MED ORDER — ONDANSETRON HCL 4 MG/2ML IJ SOLN
INTRAMUSCULAR | Status: DC | PRN
  Administered 2017-09-10: 4 mg via INTRAVENOUS

## 2017-09-10 MED ORDER — PHENYLEPHRINE HCL 10 MG/ML IJ SOLN
INTRAMUSCULAR | Status: DC | PRN
  Administered 2017-09-10 (×2): 80 ug via INTRAVENOUS

## 2017-09-10 MED ORDER — FENTANYL CITRATE (PF) 100 MCG/2ML IJ SOLN
INTRAMUSCULAR | Status: AC
  Filled 2017-09-10: qty 2

## 2017-09-10 MED ORDER — NALOXONE HCL 0.4 MG/ML IJ SOLN: 0.4000 mg | INTRAMUSCULAR | Status: DC | PRN

## 2017-09-10 MED ORDER — MORPHINE SULFATE (PF) 0.5 MG/ML IJ SOLN
INTRAMUSCULAR | Status: DC | PRN
  Administered 2017-09-10: .2 mg via INTRATHECAL

## 2017-09-10 MED ORDER — NALOXONE HCL 4 MG/10ML IJ SOLN
1.0000 ug/kg/h | INTRAVENOUS | Status: DC | PRN
  Filled 2017-09-10: qty 5

## 2017-09-10 MED ORDER — ZOLPIDEM TARTRATE 5 MG PO TABS: 5.0000 mg | ORAL_TABLET | Freq: Every evening | ORAL | Status: DC | PRN

## 2017-09-10 MED ORDER — SIMETHICONE 80 MG PO CHEW
80.0000 mg | CHEWABLE_TABLET | ORAL | Status: DC
  Administered 2017-09-11 – 2017-09-12 (×2): 80 mg via ORAL
  Filled 2017-09-10 (×3): qty 1

## 2017-09-10 MED ORDER — ONDANSETRON HCL 4 MG/2ML IJ SOLN: 4.0000 mg | Freq: Three times a day (TID) | INTRAMUSCULAR | Status: DC | PRN

## 2017-09-10 MED ORDER — OXYCODONE HCL 5 MG PO TABS: 5.0000 mg | ORAL_TABLET | Freq: Once | ORAL | Status: DC | PRN

## 2017-09-10 MED ORDER — BUPIVACAINE IN DEXTROSE 0.75-8.25 % IT SOLN
INTRATHECAL | Status: DC | PRN
  Administered 2017-09-10: 1.6 mL via INTRATHECAL

## 2017-09-10 MED ORDER — NALBUPHINE HCL 10 MG/ML IJ SOLN: 5.0000 mg | Freq: Once | INTRAMUSCULAR | Status: DC | PRN

## 2017-09-10 MED ORDER — SIMETHICONE 80 MG PO CHEW
80.0000 mg | CHEWABLE_TABLET | Freq: Three times a day (TID) | ORAL | Status: DC
  Administered 2017-09-11 – 2017-09-13 (×6): 80 mg via ORAL
  Filled 2017-09-10 (×6): qty 1

## 2017-09-10 MED ORDER — NALBUPHINE HCL 10 MG/ML IJ SOLN: 5.0000 mg | INTRAMUSCULAR | Status: DC | PRN

## 2017-09-10 MED ORDER — DIPHENHYDRAMINE HCL 50 MG/ML IJ SOLN: 12.5000 mg | INTRAMUSCULAR | Status: DC | PRN

## 2017-09-10 MED ORDER — LACTATED RINGERS IV SOLN: INTRAVENOUS | Status: DC

## 2017-09-10 MED ORDER — FENTANYL CITRATE (PF) 100 MCG/2ML IJ SOLN
INTRAMUSCULAR | Status: DC | PRN
  Administered 2017-09-10: 10 ug via INTRATHECAL
  Administered 2017-09-10: 90 ug via INTRAVENOUS

## 2017-09-10 MED ORDER — PROMETHAZINE HCL 25 MG/ML IJ SOLN: 6.2500 mg | INTRAMUSCULAR | Status: DC | PRN

## 2017-09-10 MED ORDER — MORPHINE SULFATE (PF) 0.5 MG/ML IJ SOLN
INTRAMUSCULAR | Status: AC
  Filled 2017-09-10: qty 10

## 2017-09-10 MED ORDER — OXYCODONE HCL 5 MG/5ML PO SOLN: 5.0000 mg | Freq: Once | ORAL | Status: DC | PRN

## 2017-09-10 MED ORDER — COCONUT OIL OIL
1.0000 "application " | TOPICAL_OIL | Status: DC | PRN
  Administered 2017-09-11: 1 via TOPICAL
  Filled 2017-09-10: qty 120

## 2017-09-10 MED ORDER — IBUPROFEN 600 MG PO TABS
600.0000 mg | ORAL_TABLET | Freq: Four times a day (QID) | ORAL | Status: DC
  Administered 2017-09-11 – 2017-09-13 (×11): 600 mg via ORAL
  Filled 2017-09-10 (×11): qty 1

## 2017-09-10 MED ORDER — LACTATED RINGERS IV SOLN
INTRAVENOUS | Status: DC
  Administered 2017-09-10 (×2): via INTRAVENOUS

## 2017-09-10 MED ORDER — OXYCODONE HCL 5 MG PO TABS: 10.0000 mg | ORAL_TABLET | ORAL | Status: DC | PRN

## 2017-09-10 MED ORDER — DIPHENHYDRAMINE HCL 25 MG PO CAPS
25.0000 mg | ORAL_CAPSULE | ORAL | Status: DC | PRN
  Filled 2017-09-10: qty 1

## 2017-09-10 MED ORDER — WITCH HAZEL-GLYCERIN EX PADS
1.0000 "application " | MEDICATED_PAD | CUTANEOUS | Status: DC | PRN
  Administered 2017-09-11: 1 via TOPICAL

## 2017-09-10 MED ORDER — KETOROLAC TROMETHAMINE 30 MG/ML IJ SOLN: 30.0000 mg | Freq: Four times a day (QID) | INTRAMUSCULAR | Status: AC | PRN

## 2017-09-10 MED ORDER — OXYTOCIN 10 UNIT/ML IJ SOLN
INTRAMUSCULAR | Status: DC | PRN
  Administered 2017-09-10: 40 [IU] via INTRAVENOUS

## 2017-09-10 MED ORDER — MEPERIDINE HCL 25 MG/ML IJ SOLN: 6.2500 mg | INTRAMUSCULAR | Status: DC | PRN

## 2017-09-10 MED ORDER — TETANUS-DIPHTH-ACELL PERTUSSIS 5-2.5-18.5 LF-MCG/0.5 IM SUSP: 0.5000 mL | Freq: Once | INTRAMUSCULAR | Status: DC

## 2017-09-10 MED ORDER — DIPHENHYDRAMINE HCL 25 MG PO CAPS: 25.0000 mg | ORAL_CAPSULE | Freq: Four times a day (QID) | ORAL | Status: DC | PRN

## 2017-09-10 MED ORDER — OXYTOCIN 40 UNITS IN LACTATED RINGERS INFUSION - SIMPLE MED: 2.5000 [IU]/h | INTRAVENOUS | Status: AC

## 2017-09-10 MED ORDER — SIMETHICONE 80 MG PO CHEW: 80.0000 mg | CHEWABLE_TABLET | ORAL | Status: DC | PRN

## 2017-09-10 MED ORDER — CEFAZOLIN SODIUM-DEXTROSE 2-4 GM/100ML-% IV SOLN
2.0000 g | INTRAVENOUS | Status: DC
  Filled 2017-09-10: qty 100

## 2017-09-10 MED ORDER — MENTHOL 3 MG MT LOZG: 1.0000 | LOZENGE | OROMUCOSAL | Status: DC | PRN

## 2017-09-10 MED ORDER — PRENATAL MULTIVITAMIN CH
1.0000 | ORAL_TABLET | Freq: Every day | ORAL | Status: DC
  Administered 2017-09-11 – 2017-09-13 (×3): 1 via ORAL
  Filled 2017-09-10 (×3): qty 1

## 2017-09-10 MED ORDER — SCOPOLAMINE 1 MG/3DAYS TD PT72: 1.0000 | MEDICATED_PATCH | Freq: Once | TRANSDERMAL | Status: DC

## 2017-09-10 MED ORDER — SENNOSIDES-DOCUSATE SODIUM 8.6-50 MG PO TABS
2.0000 | ORAL_TABLET | ORAL | Status: DC
  Administered 2017-09-11 – 2017-09-12 (×3): 2 via ORAL
  Filled 2017-09-10 (×3): qty 2

## 2017-09-10 SURGICAL SUPPLY — 36 items
CHLORAPREP W/TINT 26ML (MISCELLANEOUS) ×4 IMPLANT
CLAMP CORD UMBIL (MISCELLANEOUS) IMPLANT
CLOTH BEACON ORANGE TIMEOUT ST (SAFETY) ×2 IMPLANT
DERMABOND ADVANCED (GAUZE/BANDAGES/DRESSINGS) ×1
DERMABOND ADVANCED .7 DNX12 (GAUZE/BANDAGES/DRESSINGS) ×1 IMPLANT
DRSG OPSITE POSTOP 4X10 (GAUZE/BANDAGES/DRESSINGS) ×2 IMPLANT
ELECT REM PT RETURN 9FT ADLT (ELECTROSURGICAL) ×2
ELECTRODE REM PT RTRN 9FT ADLT (ELECTROSURGICAL) ×1 IMPLANT
EXTRACTOR VACUUM BELL STYLE (SUCTIONS) IMPLANT
GLOVE BIOGEL PI IND STRL 7.0 (GLOVE) ×1 IMPLANT
GLOVE BIOGEL PI IND STRL 8 (GLOVE) ×1 IMPLANT
GLOVE BIOGEL PI INDICATOR 7.0 (GLOVE) ×1
GLOVE BIOGEL PI INDICATOR 8 (GLOVE) ×1
GLOVE ECLIPSE 8.0 STRL XLNG CF (GLOVE) ×2 IMPLANT
GOWN STRL REUS W/TWL LRG LVL3 (GOWN DISPOSABLE) ×4 IMPLANT
KIT ABG SYR 3ML LUER SLIP (SYRINGE) ×2 IMPLANT
NEEDLE HYPO 18GX1.5 BLUNT FILL (NEEDLE) ×2 IMPLANT
NEEDLE HYPO 22GX1.5 SAFETY (NEEDLE) ×2 IMPLANT
NEEDLE HYPO 25X5/8 SAFETYGLIDE (NEEDLE) ×2 IMPLANT
NS IRRIG 1000ML POUR BTL (IV SOLUTION) ×2 IMPLANT
PACK C SECTION WH (CUSTOM PROCEDURE TRAY) ×2 IMPLANT
PAD OB MATERNITY 4.3X12.25 (PERSONAL CARE ITEMS) ×2 IMPLANT
PENCIL SMOKE EVAC W/HOLSTER (ELECTROSURGICAL) ×2 IMPLANT
RTRCTR C-SECT PINK 25CM LRG (MISCELLANEOUS) IMPLANT
SUT CHROMIC 0 CT 1 (SUTURE) ×2 IMPLANT
SUT MNCRL 0 VIOLET CTX 36 (SUTURE) ×2 IMPLANT
SUT MONOCRYL 0 CTX 36 (SUTURE) ×2
SUT PLAIN 2 0 (SUTURE)
SUT PLAIN 2 0 XLH (SUTURE) IMPLANT
SUT PLAIN ABS 2-0 CT1 27XMFL (SUTURE) IMPLANT
SUT VIC AB 0 CTX 36 (SUTURE) ×1
SUT VIC AB 0 CTX36XBRD ANBCTRL (SUTURE) ×1 IMPLANT
SUT VIC AB 4-0 KS 27 (SUTURE) IMPLANT
SYR 20CC LL (SYRINGE) ×4 IMPLANT
TOWEL OR 17X24 6PK STRL BLUE (TOWEL DISPOSABLE) ×2 IMPLANT
TRAY FOLEY W/BAG SLVR 14FR LF (SET/KITS/TRAYS/PACK) IMPLANT

## 2017-09-10 NOTE — Telephone Encounter (Signed)
Preadmission screen  

## 2017-09-10 NOTE — Patient Instructions (Signed)
External cephalic version ? ?

## 2017-09-10 NOTE — Consult Note (Signed)
[facility]  Delivery Note:  C-section       09/10/2017  7:05 PM  I was called to the operating room at the request of the patient's obstetrician (Dr. Despina HiddenEure) for a primary c-section due to breech presentation and ROM.  PRENATAL HX:  This is a 22 y/o G2P0010 at 7036 and 4/[redacted] weeks gestation who was admitted with PROM (ROM 4 hours).  Delivery by c-section for breech presentation.     DELIVERY:  Cord clamping delayed 1 minute.  Infant was vigorous at delivery, requiring no resuscitation other than standard warming, drying and stimulation.  APGARs 8 and 9.  Exam notable for bilateral abduction of forefeet, left > right, would recommend PT consult and/or outpatient follow up if exam remains consistent.  After 5 minutes, baby left with nurse to assist parents with skin-to-skin care.   _____________________ Electronically Signed By: Maryan CharLindsey Arden Tinoco, MD Neonatologist

## 2017-09-10 NOTE — MAU Note (Signed)
Started leaking around 3, clear fluid still leaking. Had a little spotting prior to the leaking,  Had a cervical  Exam in office today, 1cm. Has had some very light cramps.

## 2017-09-10 NOTE — Op Note (Signed)
Cesarean Section Operative Report  Maria HickmanBrittany N Harriger  PROCEDURE DATE: 09/10/2017  PREOPERATIVE DIAGNOSES: Intrauterine pregnancy at 2556w4d weeks gestation; Breech presentaion; PPROM  POSTOPERATIVE DIAGNOSES: The same  PROCEDURE: Primary Low Transverse Cesarean Section  SURGEON:   Surgeon(s) and Role:    * Eure, Amaryllis DykeLuther H, MD - Primary    * Zuhayr Deeney, Kandra NicolasJulie P, MD - OB Fellow   INDICATIONS: Maria Serrano is a 22 y.o. G2P0010 at 756w4d here for cesarean section secondary to the indications listed under preoperative diagnoses; please see preoperative note for further details.  The risks of cesarean section were discussed with the patient including but were not limited to: bleeding which may require transfusion or reoperation; infection which may require antibiotics; injury to bowel, bladder, ureters or other surrounding organs; injury to the fetus; need for additional procedures including hysterectomy in the event of a life-threatening hemorrhage; placental abnormalities wth subsequent pregnancies, incisional problems, thromboembolic phenomenon and other postoperative/anesthesia complications.   The patient concurred with the proposed plan, giving informed written consent for the procedure.    FINDINGS:  Viable female infant in complete breech presentation.  Apgars 8 and 9. Umbilical artery pH 7.30. Clear amniotic fluid.  Intact placenta, three vessel cord.  Normal uterus, fallopian tubes and ovaries bilaterally.  ANESTHESIA: Spinal INTRAVENOUS FLUIDS: Total I/O In: -  Out: 1800 [Urine:1800] ESTIMATED BLOOD LOSS: 1324 mL URINE OUTPUT:  300 ml SPECIMENS: Placenta sent to pathology COMPLICATIONS: None immediate  PROCEDURE IN DETAIL:  The patient preoperatively received intravenous antibiotics and had sequential compression devices applied to her lower extremities.  She was then taken to the operating room where spinal anesthesia was administered and was found to be adequate. She was then placed  in a dorsal supine position with a leftward tilt, and prepped and draped in a sterile manner.  A foley catheter was placed into her bladder and attached to constant gravity.     A time out was held and the above information confirmed.  A low transverse incision was made and carried down through the subcutaneous tissue to the fascia. Fascial incision was made and extended transversely. The fascia was separated from the underlying rectus tissue superiorly and inferiorly. The peritoneum was identified and entered. Peritoneal incision was extended longitudinally. A low transverse hysterotomy was made with a scalpel and extended bilaterally bluntly.  The infant was successfully breech extracted, the cord was clamped and cut after one minute, and the infant was handed over to the awaiting neonatology team. Cord blood was obtained for evaluation. The placenta was delivered intact and appeared normal. The uterine outline, tubes and ovaries appeared normal. The uterine incision was closed with running locked sutures of 0 Monocryl, and an imbricating layer was also placed with running locked 0 Monocryl. Hemostasis was observed. Lavage was carried out until clear. The peritoneum and rectus muscle was approximated using a single interrupted Chromic suture. The fascia was then closed using 0 Vicryl in a running fashion.  The subcutaneous layer was irrigated, then reapproximated with 2-0 plain gut running stich, and  the skin was closed with a 4-0 Vicryl subcuticular stitch.    Disposition: PACU - hemodynamically stable.   Maternal Condition: stable    Signed: Frederik PearJulie P Alaia Lordi, MD OB Fellow 09/10/2017 7:32 PM .

## 2017-09-10 NOTE — Telephone Encounter (Signed)
Pt called and stated she was seen in the office this morning by Drenda FreezeFran. She had a cervical check done. She just got home and says she is leaking fluid but not having contractions. Discussed with Tish, and [facility] Pt understood. 09/10/17 JM

## 2017-09-10 NOTE — Anesthesia Procedure Notes (Signed)
Spinal  Patient location during procedure: OR Start time: 09/10/2017 6:32 PM End time: 09/10/2017 6:35 PM Staffing Anesthesiologist: Beryle LatheBrock, Thomas E, MD Performed: anesthesiologist  Preanesthetic Checklist Completed: patient identified, surgical consent, pre-op evaluation, timeout performed, IV checked, risks and benefits discussed and monitors and equipment checked Spinal Block Patient position: sitting Prep: DuraPrep Patient monitoring: heart rate, cardiac monitor, continuous pulse ox and blood pressure Approach: midline Location: L3-4 Injection technique: single-shot Needle Needle type: Pencan  Needle gauge: 24 G Additional Notes Functioning IV was confirmed and monitors were applied. Sterile prep and drape, including hand hygiene, mask, and sterile gloves were used. The patient was positioned and the spine was prepped. The skin was anesthetized with lidocaine. Free flow of clear CSF was obtained prior to injecting local anesthetic into the CSF. The spinal needle aspirated freely following injection. The needle was carefully withdrawn. The patient tolerated the procedure well. Consent was obtained prior to the procedure with all questions answered and concerns addressed. Risks including, but not limited to, bleeding, infection, nerve damage, paralysis, failed block, inadequate analgesia, allergic reaction, high spinal, itching, and headache were discussed and the patient wished to proceed.  Leslye Peerhomas Brock, MD

## 2017-09-10 NOTE — Transfer of Care (Signed)
Immediate Anesthesia Transfer of Care Note  Patient: Maria Serrano  Procedure(s) Performed: CESAREAN SECTION (N/A )  Patient Location: PACU  Anesthesia Type:Spinal  Level of Consciousness: awake and alert   Airway & Oxygen Therapy: Patient Spontanous Breathing  Post-op Assessment: Report given to RN and Post -op Vital signs reviewed and stable  Post vital signs: Reviewed and stable  Last Vitals:  Vitals Value Taken Time  BP    Temp    Pulse 83 09/10/2017  7:48 PM  Resp 16 09/10/2017  7:48 PM  SpO2 96 % 09/10/2017  7:48 PM  Vitals shown include unvalidated device data.  Last Pain:  Vitals:   09/10/17 1602  TempSrc: Oral  PainSc:          Complications: No apparent anesthesia complications

## 2017-09-10 NOTE — Progress Notes (Signed)
  G2P0010 3037w4d Estimated Date of Delivery: 10/04/17  Last menstrual period 12/19/2016, unknown if currently breastfeeding.   BP weight and urine results all reviewed and noted.  Please refer to the obstetrical flow sheet for the fundal height and fetal heart rate documentation:  Patient reports good fetal movement, denies any bleeding and no rupture of membranes symptoms or regular contractions. Patient is without complaints. All questions were answered.   Physical Assessment:  There were no vitals filed for this visit.There is no height or weight on file to calculate BMI.        Physical Examination:   General appearance: Well appearing, and in no distress  Mental status: Alert, oriented to person, place, and time  Skin: Warm & dry  Cardiovascular: Normal heart rate noted  Respiratory: Normal respiratory effort, no distress  Abdomen: Soft, gravid, nontender  Pelvic: Cervical exam performed         Extremities:    Fetal Status:         US 36+4 wks,complete breech,posterior pl gr 2,normal ovaries bilat,AFI 14 cm,fhr 164 bpm,EFW 3902 g 99%,BPD 98%,HC 99%,AC 99%,FL 70%  JVF discussed ECV w/pt, pt desiresa No results found for this or any previous visit (from the past 24 hour(s)).   No orders of the defined types were placed in this encounter.   Plan:  Continued routine obstetrical care,   No follow-ups on file.

## 2017-09-10 NOTE — Anesthesia Postprocedure Evaluation (Signed)
Anesthesia Post Note  Patient: Maria Serrano  Procedure(s) Performed: CESAREAN SECTION (N/A )     Patient location during evaluation: PACU Anesthesia Type: Spinal Level of consciousness: awake and alert Pain management: pain level controlled Vital Signs Assessment: post-procedure vital signs reviewed and stable Respiratory status: spontaneous breathing and respiratory function stable Cardiovascular status: blood pressure returned to baseline and stable Postop Assessment: spinal receding and no apparent nausea or vomiting Anesthetic complications: no    Last Vitals:  Vitals:   09/10/17 2115 09/10/17 2154  BP: 104/75 112/71  Pulse: 70 65  Resp: 19 18  Temp:  37.1 C  SpO2: 98% 98%    Last Pain:  Vitals:   09/10/17 2154  TempSrc: Oral  PainSc:    Pain Goal:                 Beryle Lathehomas E Brock

## 2017-09-10 NOTE — H&P (Signed)
Obstetric Preoperative History and Physical  Maria Serrano is a 22 y.o. G2P0010 with IUP at 8640w4d presenting for PPROM. She reports LOF around 1500 today. She was seen earlier in the office today and baby was breech presentation.   Prenatal Course Source [facility] Care: [facility]  Pregnancy complications or risks: Patient Active Problem List   Diagnosis Date Noted  . Low lying placenta with hemorrhage, antepartum 05/01/2017  . Subchorionic hematoma, antepartum 04/03/2017  . Vaginal bleeding in pregnancy, first trimester 03/24/2017  . Rubella non-immune status, antepartum 03/19/2017  . Supervision of normal pregnancy 03/13/2017  . Trisomy 18 in child of prior pregnancy, currently pregnant in first trimester 03/13/2017   She plans to breastfeed She desires IUD for postpartum contraception.   Prenatal labs and studies: ABO, Rh:   Antibody: Negative (04/18 0920) Rubella: 0.92 (12/20 1600) RPR: Non Reactive (04/18 0920)  HBsAg: Negative (12/20 1600)  HIV: Non Reactive (04/18 0920)  GBS: n/a 2 hr GTT: negative Genetic screening normal Anatomy US normal, limited view of feet  Prenatal Transfer Tool  Maternal Diabetes: No Genetic Screening: Normal Maternal Ultrasounds/Referrals: Normal Fetal Ultrasounds or other Referrals:  None Maternal Substance Abuse:  No Significant Maternal Medications:  None Significant Maternal Lab Results: None  Past Medical History:  Diagnosis Date  . Medical history non-contributory     Past Surgical History:  Procedure Laterality Date  . DILATION AND CURETTAGE OF UTERUS      OB History  Gravida Para Term Preterm AB Living  2 0   0 1 0  SAB TAB Ectopic Multiple Live Births  1            # Outcome Date GA Lbr Len/2nd Weight Sex Delivery Anes PTL Lv  2 Current           1 SAB 11/05/16 806w6d      N FD     Complications: Trisomy 1318    Social History   Socioeconomic History  . Marital status: Married    Spouse name: Not on file  .  Number of children: Not on file  . Years of education: Not on file  . Highest education level: Not on file  Occupational History  . Not on file  Social Needs  . Financial resource strain: Not on file  . Food insecurity:    Worry: Not on file    Inability: Not on file  . Transportation needs:    Medical: Not on file    Non-medical: Not on file  Tobacco Use  . Smoking status: Former Smoker    Years: 0.50  . Smokeless tobacco: Never Used  Substance and Sexual Activity  . Alcohol use: No  . Drug use: No  . Sexual activity: Yes    Birth control/protection: None  Lifestyle  . Physical activity:    Days per week: Not on file    Minutes per session: Not on file  . Stress: Not on file  Relationships  . Social connections:    Talks on phone: Not on file    Gets together: Not on file    Attends religious service: Not on file    Active member of club or organization: Not on file    Attends meetings of clubs or organizations: Not on file    Relationship status: Not on file  Other Topics Concern  . Not on file  Social History Narrative  . Not on file    Family History  Problem Relation Age of  Onset  . COPD Maternal Grandfather   . Cancer Maternal Grandfather        lung  . Hypertension Father   . Endometriosis Maternal Aunt     Medications Prior to Admission  Medication Sig Dispense Refill Last Dose  . acetaminophen (TYLENOL) 500 MG tablet Take 1,000 mg by mouth every 6 (six) hours as needed for mild pain.    Past Week at Unknown time  . omeprazole (PRILOSEC) 20 MG capsule Take 1 capsule (20 mg total) by mouth daily. 30 capsule 6 09/09/2017 at Unknown time  . Prenatal MV-Min-FA-Omega-3 (PRENATAL GUMMIES/DHA & FA PO) Take by mouth. Takes 2 daily   09/10/2017 at Unknown time    Allergies  Allergen Reactions  . Amoxicillin Other (See Comments)    Childhood allergy.    Review of Systems: Negative except for what is mentioned in HPI.  Physical Exam: BP 118/71 (BP  Location: Right Arm)   Pulse 96   Temp 99.2 F (37.3 C) (Oral)   Resp 18   Wt 199 lb 12 oz (90.6 kg)   LMP 12/19/2016 (Exact Date)   BMI 35.38 kg/m  FHT: baseline rate 135, moderate variability, +acel, no decel  CONSTITUTIONAL: Well-developed, well-nourished female in no acute distress.  HENT:  Normocephalic, atraumatic. Oropharynx is clear and moist EYES: Conjunctivae and EOM are normal. No scleral icterus.  NECK: Normal range of motion, supple SKIN: Skin is warm and dry. No rash noted. Not diaphoretic. No erythema. NEUROLGIC: Alert and oriented to person, place, and time. Normal reflexes, muscle tone coordination. No cranial nerve deficit noted. PSYCHIATRIC: Normal mood and affect. Normal behavior. CARDIOVASCULAR: Normal heart rate noted, regular rhythm RESPIRATORY: Effort and breath sounds normal, no problems with respiration noted ABDOMEN: Soft, nontender, nondistended, gravid.  MUSCULOSKELETAL: Normal range of motion. No edema and no tenderness. 2+ distal pulses.  Pertinent Labs/Studies:   Results for orders placed or performed during the hospital encounter of 09/10/17 (from the past 72 hour(s))  POCT fern test     Status: None   Collection Time: 09/10/17  4:26 PM  Result Value Ref Range   POCT Fern Test Positive = ruptured amniotic membanes     Assessment and Plan :Maria Serrano is a 22 y.o. G2P0010 at [redacted]w[redacted]d being admitted for cesarean section due to fetal malpresentaiton (breech) and PPROM. The risks of cesarean section discussed with the patient included but were not limited to: bleeding which may require transfusion or reoperation; infection which may require antibiotics; injury to bowel, bladder, ureters or other surrounding organs; injury to the fetus; need for additional procedures including hysterectomy in the event of a life-threatening hemorrhage; placental abnormalities wth subsequent pregnancies, incisional problems, thromboembolic phenomenon and other  postoperative/anesthesia complications. The patient concurred with the proposed plan, giving informed written consent for the procedure. Anesthesia and OR aware. Preoperative prophylactic antibiotics and SCDs ordered on call to the OR. To OR when ready.   One dose of BMZ given d/t GA < 37 weeks  Kandra Nicolas. Degele, MD OB Fellow Faculty Practice, [facility]

## 2017-09-10 NOTE — Anesthesia Preprocedure Evaluation (Signed)
Anesthesia Evaluation  Patient identified by MRN, date of birth, ID band Patient awake    Reviewed: Allergy & Precautions, NPO status , Patient's Chart, lab work & pertinent test results  Airway Mallampati: II  TM Distance: >3 FB Neck ROM: Full    Dental   Pulmonary former smoker,    breath sounds clear to auscultation       Cardiovascular negative cardio ROS   Rhythm:Regular Rate:Normal     Neuro/Psych negative neurological ROS  negative psych ROS   GI/Hepatic negative GI ROS, Neg liver ROS,   Endo/Other  Obesity  Renal/GU negative Renal ROS  negative genitourinary   Musculoskeletal negative musculoskeletal ROS (+)   Abdominal   Peds  Hematology negative hematology ROS (+)   Anesthesia Other Findings   Reproductive/Obstetrics (+) Pregnancy                             Anesthesia Physical Anesthesia Plan  ASA: II  Anesthesia Plan: Spinal   Post-op Pain Management:    Induction:   PONV Risk Score and Plan: 2 and Treatment may vary due to age or medical condition and Ondansetron  Airway Management Planned: Natural Airway  Additional Equipment: None  Intra-op Plan:   Post-operative Plan:   Informed Consent: I have reviewed the patients History and Physical, chart, labs and discussed the procedure including the risks, benefits and alternatives for the proposed anesthesia with the patient or authorized representative who has indicated his/her understanding and acceptance.     Plan Discussed with: CRNA and Anesthesiologist  Anesthesia Plan Comments:         Anesthesia Quick Evaluation

## 2017-09-10 NOTE — Progress Notes (Signed)
US 36+4 wks,complete breech,posterior pl gr 2,normal ovaries bilat,AFI 14 cm,fhr 164 bpm,EFW 3902 g 99%,BPD 98%,HC 99%,AC 99%,FL 70%

## 2017-09-11 ENCOUNTER — Encounter (HOSPITAL_COMMUNITY): Payer: Self-pay | Admitting: Obstetrics & Gynecology

## 2017-09-11 LAB — CBC
HCT: 27.9 % — ABNORMAL LOW (ref 36.0–46.0)
HEMOGLOBIN: 9.4 g/dL — AB (ref 12.0–15.0)
MCH: 29.8 pg (ref 26.0–34.0)
MCHC: 33.7 g/dL (ref 30.0–36.0)
MCV: 88.6 fL (ref 78.0–100.0)
Platelets: 216 10*3/uL (ref 150–400)
RBC: 3.15 MIL/uL — ABNORMAL LOW (ref 3.87–5.11)
RDW: 13.4 % (ref 11.5–15.5)
WBC: 14.2 10*3/uL — ABNORMAL HIGH (ref 4.0–10.5)

## 2017-09-11 LAB — CREATININE, SERUM
CREATININE: 0.56 mg/dL (ref 0.44–1.00)
GFR calc Af Amer: >60 mL/min (ref >60)

## 2017-09-11 LAB — RPR: RPR Ser Ql: NONREACTIVE

## 2017-09-11 NOTE — Lactation Note (Signed)
This note was copied from a baby's chart. Lactation Consultation Note Baby 6 hrs old. Not interested in BF. Baby has slight distended abd. Newborn behavior, feeding habits discussed. Mom has large pendulous breast w/small flat nipples. Shells given. Mom wearing #20 NS w/colostrum noted where baby suckled a few times. Encouraged mom to attempt feed w/cues or at least every 2 1/2 -3 hrs if hasn't cued. STS encouraged, hand expression and I&O reviewed. Mom had DEBP kit in rm. LC brought DEBP. RN to set up and demonstrated when mom is ready.  Encouraged mom to rest while baby is resting.  LPI information sheet reviewed. Mom encouraged to feed baby 8-12 times/24 hours and with feeding cues.  [place] brochure given w/resources, support groups and [facility] services. Patient Name: Boy Olubunmi Rothenberger [id number] Date: 09/11/2017 Reason for consult: Initial assessment;Late-preterm 34-36.6wks   Maternal Data    Feeding Feeding Type: Breast Fed Length of feed: 5 min  LATCH Score Latch: Too sleepy or reluctant, no latch achieved, no sucking elicited.  Audible Swallowing: None  Type of Nipple: Flat  Comfort (Breast/Nipple): Soft / non-tender  Hold (Positioning): Assistance needed to correctly position infant at breast and maintain latch.  LATCH Score: 4  Interventions Interventions: Breast feeding basics reviewed;Support pillows;Assisted with latch;Position options;Skin to skin;Expressed milk;Breast massage;Hand express;Shells;Pre-pump if needed;DEBP;Breast compression;Adjust position  Lactation Tools Discussed/Used Tools: Shells;Pump;Nipple Shields Nipple shield size: 16;20 Shell Type: Inverted Breast pump type: Double-Electric Breast Pump   Consult Status Consult Status: Follow-up Date: 09/11/17 Follow-up type: In-patient    Robertha Staples, Elta Gu[place] 09/11/2017, 1:41 AM

## 2017-09-11 NOTE — Progress Notes (Signed)
Patient informed of plan of care. Patient told about all meds she can have for pain. Patient assisted up to bathroom with slight dizzyness. But did well after. Foley dc'd without any difficulty. Patient's incentive spirometer at 15 hours after surgey was still in the bag. The infant's bulb suction was still in the bag as well. I educated mother on the incentive spirometer and how to use bulb suction and noticed color changes on infant.. DEBP set up for mom it was in the bag as well. I told mom to feed on cues. Mother states she is comfortable with hand expression. All questions answered. Patient is passing gas. Told to walk halls and drink fluids.

## 2017-09-11 NOTE — Progress Notes (Addendum)
Subjective: Postpartum Day 1: Cesarean Delivery Patient reports tolerating PO and + flatus. Foley just out, has not voided yet.  Pain is well controlled. Slightly dizzy earlier when first standing but now feeling fine.  Objective: Vital signs in last 24 hours: Temp:  [97.9 F (36.6 C)-99.3 F (37.4 C)] 98 F (36.7 C) (06/20 0534) Pulse Rate:  [65-99] 70 (06/20 0208) Resp:  [17-24] 18 (06/20 0208) BP: (97-118)/(52-80) 102/52 (06/20 0208) SpO2:  [96 %-100 %] 98 % (06/20 0540) Weight:  [199 lb 12 oz (90.6 kg)-201 lb (91.2 kg)] 199 lb 12 oz (90.6 kg) (06/19 1602)  Physical Exam:  General: alert, cooperative and no distress CVS: RRR Resp: LCTAB Lochia: appropriate Uterine Fundus: firm Incision: covered by dressing, no evidence of active bleeding or infection DVT Evaluation: No evidence of DVT seen on physical exam.  Recent Labs    09/10/17 1730 09/11/17 0521  HGB 10.9* 9.4*  HCT 33.3* 27.9*    Assessment/Plan: Status post Cesarean section. Doing well postoperatively. Due to void this am.  Continue current care.  Garnette Gunneraron B Thompson 09/11/2017, 8:50 AM    Attestation of Attending Supervision of Provider:  Evaluation and management procedures were performed by this provider under my supervision and collaboration. I have independently seen and examined this patient. I have made changes as necessary and agree with the above, management and plan.    Baldemar LenisK. Meryl Davis, M.D. Attending Obstetrician & Gynecologist, [facility], [facility]  09/11/2017 9:55 AM

## 2017-09-12 ENCOUNTER — Encounter (HOSPITAL_COMMUNITY): Payer: Self-pay | Admitting: *Deleted

## 2017-09-12 LAB — STREP GP B NAA+RFLX: Strep Gp B NAA+Rflx: NEGATIVE

## 2017-09-12 LAB — GC/CHLAMYDIA PROBE AMP
Chlamydia trachomatis, NAA: NEGATIVE
Neisseria gonorrhoeae by PCR: NEGATIVE

## 2017-09-12 MED ORDER — OXYCODONE HCL 10 MG PO TABS: 10.0000 mg | ORAL_TABLET | ORAL | 0 refills | Status: DC | PRN

## 2017-09-12 MED ORDER — IBUPROFEN 600 MG PO TABS: 600.0000 mg | ORAL_TABLET | Freq: Four times a day (QID) | ORAL | 0 refills | Status: DC

## 2017-09-12 MED ORDER — MEASLES, MUMPS & RUBELLA VAC ~~LOC~~ INJ
0.5000 mL | INJECTION | Freq: Once | SUBCUTANEOUS | Status: DC
  Filled 2017-09-12: qty 0.5

## 2017-09-12 NOTE — Progress Notes (Signed)
Subjective: Postpartum Day 1: Cesarean Delivery Patient reports tolerating PO, + flatus and no problems voiding.    Objective: Vital signs in last 24 hours: Temp:  [97.5 F (36.4 C)-98.2 F (36.8 C)] 97.5 F (36.4 C) (06/20 2353) Pulse Rate:  [62-85] 62 (06/21 0500) Resp:  [18] 18 (06/20 2353) BP: (95-105)/(58-69) 105/69 (06/21 0500) SpO2:  [98 %-100 %] 99 % (06/20 2215)  Physical Exam:  General: alert, cooperative and no distress Lochia: appropriate Uterine Fundus: firm Incision: healing well, no significant drainage DVT Evaluation: No evidence of DVT seen on physical exam.  Recent Labs    09/10/17 1730 09/11/17 0521  HGB 10.9* 9.4*  HCT 33.3* 27.9*    Assessment/Plan: Status post Cesarean section. Doing well postoperatively.  Continue current care.  Garnette Gunneraron B Thompson 09/12/2017, 7:19 AM

## 2017-09-13 ENCOUNTER — Ambulatory Visit: Payer: Self-pay

## 2017-09-13 NOTE — Discharge Instructions (Signed)

## 2017-09-13 NOTE — Lactation Note (Signed)
This note was copied from a baby's chart. Lactation Consultation Note  Patient Name: Maria Alonza SmokerBrittany Serrano [id number]'s Date: 09/13/2017 Reason for consult: Follow-up assessment;Late-preterm 34-36.6wks;Engorgement  69 hours old late pre-term baby patient, mom has already been seen by lactation this morning but RN called LC for assistance because she noticed mom is getting engorged/full, her milk is coming in. Mom breast felt full upon examination and she had some areas with some hard knots. Advised mom to keep doing ice packs for 20 minutes before attempting to pump; and to use coconut oil prior pumping. Reviewed supplementation guidelines, mom has been supplementing with formula all this time, explained to mom that now that she has EBM she should use it first before using any formula to supplement her baby. Mom already doing a bottle nipple, suggested a curved tip syringe but also brought a slow flow nipple in case mom wants to use it. She reported no further questions and will call for assistance if needed.  Maternal Data    Feeding Feeding Type: Formula Nipple Type: Slow - flow Length of feed: 10 min   Interventions Interventions: Breast feeding basics reviewed  Lactation Tools Discussed/Used     Consult Status Consult Status: Follow-up Date: 09/14/17 Follow-up type: In-patient    Maria Serrano 09/13/2017, 4:06 PM

## 2017-09-13 NOTE — Lactation Note (Signed)
This note was copied from a baby's chart. Lactation Consultation Note: Staff nurse request assistance with latching infant on using a nipple shield. Mother was fit with a #16 nipple shield earlier. Staff nurse fit with #20 nipple shield.  Infant placed on the left breast in football hold. Several attempts to latch infant using tea-cup hold. Father of infant at bedside for teaching. Infant latched on and off for a few good strong sucks and swallows.  Mother was taught proper application of the nipple shield. Advised mother in care of the shield.  Suggested that mother offer bare breast first and if infant unable to latch use the nipple shield. Observed infant with the nipple shield inplace over left nipple in cross cradle hold. Several attempts to flange infants lips for wider gape. Infants lips remained pursed. Observed infant with suckling and swallows. Infant sustained latch for 20 mins. Discussed paced bottle feeding with formula or any amt of ebm. Observed milk in the nipple shield.  Mother advised to post pump for 15 mins after each feeding. Parents are giving formula with a slow flow bottle nipple. Parents have LPI supplement guidelines. Assist mother with hand expression and observed good flow of colostrum. Encouraged mother to hand express before and after each feeding.   Patient Name: Boy Alonza SmokerBrittany Serrano [id number]'s Date: 09/13/2017 Reason for consult: Follow-up assessment   Maternal Data Has patient been taught Hand Expression?: Yes  Feeding Feeding Type: Formula Length of feed: 15 min(observed colostrum in the shield)  LATCH Score Latch: Repeated attempts needed to sustain latch, nipple held in mouth throughout feeding, stimulation needed to elicit sucking reflex.  Audible Swallowing: A few with stimulation  Type of Nipple: Flat  Comfort (Breast/Nipple): Filling, red/small blisters or bruises, mild/mod discomfort  Hold (Positioning): Assistance needed to correctly position infant  at breast and maintain latch.  LATCH Score: 5  Interventions Interventions: Assisted with latch;Skin to skin;Hand express;Breast compression;Adjust position;Support pillows;Position options;Expressed milk;DEBP  Lactation Tools Discussed/Used Tools: Nipple Shields Nipple shield size: 20 Breast pump type: Double-Electric Breast Pump   Consult Status Consult Status: Follow-up Date: 09/14/17 Follow-up type: In-patient    Stevan BornKendrick, Jamira Barfuss [facility] 09/13/2017, 12:06 PM

## 2017-09-13 NOTE — Discharge Summary (Addendum)
OB Discharge Summary     Patient Name: Maria Serrano DOB: [birth year] MRN: [id number]  Date of admission: 09/10/2017 Delivering MD: Duane Lope H   Date of discharge: 09/13/2017  Admitting diagnosis: 36 WKS, LEAKING FLUID Intrauterine pregnancy: [redacted]w[redacted]d     Secondary diagnosis:  Active Problems:   Status post primary low transverse cesarean section  Additional problems:  PPROM and Breach presentation H/o fetal w/ T18 Rubella non-immune     Discharge diagnosis: Term Pregnancy Delivered                                                                                                Post partum procedures:none  Augmentation: none  Complications: None  Hospital course:  Onset of Labor With Unplanned C/S  22 y.o. yo G2P0111 at [redacted]w[redacted]d was admitted in Latent Labor on 09/10/2017.  Membrane Rupture Time/Date: 3:00 PM ,09/10/2017   The patient went for cesarean section due to Malpresentation and PPROM, and delivered a Viable infant,09/10/2017  Details of operation can be found in separate operative note. Patient had an uncomplicated postpartum course.  She is ambulating,tolerating a regular diet, passing flatus, and urinating well.  Patient is discharged home in stable condition 09/13/17.  Physical exam  Vitals:   09/12/17 0500 09/12/17 1455 09/12/17 2212 09/13/17 0544  BP: 105/69 105/68 (!) 101/58 110/80  Pulse: 62 66 62 72  Resp:  18  18  Temp:  97.9 F (36.6 C) 97.8 F (36.6 C) 97.8 F (36.6 C)  TempSrc:  Oral Oral Oral  SpO2:  99%    Weight:  199 lb 12 oz (90.6 kg)    Height:  5\' 3"  (1.6 m)     General: alert, cooperative and no distress Lochia: appropriate Uterine Fundus: firm Incision: Healing well with no significant drainage DVT Evaluation: No evidence of DVT seen on physical exam. Labs: Lab Results  Component Value Date   WBC 14.2 (H) 09/11/2017   HGB 9.4 (L) 09/11/2017   HCT 27.9 (L) 09/11/2017   MCV 88.6 09/11/2017   PLT 216 09/11/2017   CMP Latest Ref Rng  & Units 09/11/2017  Glucose 65 - 99 mg/dL -  BUN 6 - 20 mg/dL -  Creatinine 1.47 - 8.29 mg/dL 5.62  Sodium 130 - 865 mmol/L -  Potassium 3.5 - 5.1 mmol/L -  Chloride 101 - 111 mmol/L -  CO2 22 - 32 mmol/L -  Calcium 8.9 - 10.3 mg/dL -  Total Protein 6.5 - 8.1 g/dL -  Total Bilirubin 0.3 - 1.2 mg/dL -  Alkaline Phos 38 - 784 U/L -  AST 15 - 41 U/L -  ALT 14 - 54 U/L -    Discharge instruction: per After Visit Summary and "Baby and Me Booklet".  After visit meds:  Allergies as of 09/13/2017      Reactions   Amoxicillin Other (See Comments)   Childhood allergy.      Medication List    STOP taking these medications   acetaminophen 500 MG tablet Commonly known as:  TYLENOL     TAKE these medications   ibuprofen  600 MG tablet Commonly known as:  ADVIL,MOTRIN Take 1 tablet (600 mg total) by mouth every 6 (six) hours.   omeprazole 20 MG capsule Commonly known as:  PRILOSEC Take 1 capsule (20 mg total) by mouth daily.   Oxycodone HCl 10 MG Tabs Take 1 tablet (10 mg total) by mouth every 4 (four) hours as needed (pain scale > 7).   PRENATAL GUMMIES/DHA & FA PO Take by mouth. Takes 2 daily       Diet: routine diet  Activity: Advance as tolerated. Pelvic rest for 6 weeks.   Outpatient follow up:2 weeks Follow up Appt: Future Appointments  Date Time Provider Department Center  09/18/2017 10:45 AM Tilda BurrowFerguson, John V, MD [facility]  10/17/2017 12:00 PM Cheral MarkerBooker, Kimberly R, CNM [facility]   Follow up Visit:No follow-ups on file.  Postpartum contraception: IUD Paragard  Newborn Data: Live born female  Birth Weight: 7 lb 13 oz (3545 g) APGAR: 8, 9  Newborn Delivery   Birth date/time:  09/10/2017 18:55:00 Delivery type:  C-Section, Low Transverse Trial of labor:  No C-section categorization:  Primary     Baby Feeding: Bottle and Breast Disposition:home with mother   09/13/2017 Garnette GunnerAaron B Thompson, MD   Upon entering the room, patient was not in the  room and housekeeping was cleaning the room. Patient had left per her request. I was unable to assess patient. During morning report, no significant issues were identified with patient and she was deemed safe for discharge based on PP on Day 3.

## 2017-09-14 ENCOUNTER — Ambulatory Visit: Payer: Self-pay

## 2017-09-14 DIAGNOSIS — Q6689 Other  specified congenital deformities of feet: Secondary | ICD-10-CM | POA: Diagnosis not present

## 2017-09-14 DIAGNOSIS — Z831 Family history of other infectious and parasitic diseases: Secondary | ICD-10-CM | POA: Diagnosis not present

## 2017-09-14 NOTE — Lactation Note (Signed)
This note was copied from a baby's chart. Lactation Consultation Note  Patient Name: Maria Serrano ZOXWR'UToday's Date: 09/14/2017 Reason for consult: Follow-up assessment   Baby 5587 hours old.  Baby 5766w4d.  Latched upon entering w/ #20NS.  Intermittent sucks and swallows observed. Mother recently pumped 20 ml of breastmilk. Sugest trying at least once per day to latch without nipple shield.  Prepump w/ manual pump. Mother's nipples are beginning to get sore.  Suggest ebm or coconut oil. Provided mother w/ shells and a hand pump and reviewed use. Recommend mother post pump 4-6 times per day for 10-20 min with DEBP on initiation setting. Give baby back volume pumped at the next feeding.     Maternal Data    Feeding Feeding Type: Breast Fed Nipple Type: Slow - flow Length of feed: 25 min(at breast)  LATCH Score Latch: Repeated attempts needed to sustain latch, nipple held in mouth throughout feeding, stimulation needed to elicit sucking reflex.(latched upon entering w/ NS)  Audible Swallowing: A few with stimulation  Type of Nipple: Flat  Comfort (Breast/Nipple): Soft / non-tender  Hold (Positioning): No assistance needed to correctly position infant at breast.  LATCH Score: 7  Interventions Interventions: Breast compression;Pre-pump if needed;DEBP;Hand pump  Lactation Tools Discussed/Used Tools: Nipple Shields Nipple shield size: 20 Shell Type: Inverted Breast pump type: Double-Electric Breast Pump;Manual   Consult Status Consult Status: Complete Date: 09/14/17    Dahlia ByesBerkelhammer, Ruth [facility] 09/14/2017, 9:55 AM

## 2017-09-15 ENCOUNTER — Inpatient Hospital Stay (HOSPITAL_COMMUNITY): Admission: RE | Admit: 2017-09-15 | Payer: BLUE CROSS/BLUE SHIELD | Source: Ambulatory Visit

## 2017-09-18 ENCOUNTER — Encounter: Payer: Self-pay | Admitting: Obstetrics and Gynecology

## 2017-09-18 ENCOUNTER — Ambulatory Visit (INDEPENDENT_AMBULATORY_CARE_PROVIDER_SITE_OTHER): Payer: BLUE CROSS/BLUE SHIELD | Admitting: Obstetrics and Gynecology

## 2017-09-18 VITALS — BP 105/67 | HR 75 | Ht 63.0 in | Wt 176.6 lb

## 2017-09-18 DIAGNOSIS — Z9889 Other specified postprocedural states: Secondary | ICD-10-CM | POA: Diagnosis not present

## 2017-09-18 DIAGNOSIS — Z09 Encounter for follow-up examination after completed treatment for conditions other than malignant neoplasm: Secondary | ICD-10-CM

## 2017-09-18 NOTE — Progress Notes (Signed)
   Subjective:  Maria HickmanBrittany N Serrano is a 22 y.o. female now 1 weeks status post cesarean section.    still sore, hesitant to drive any time soon Review of Systems Negative except    Diet:   regular   Bowel movements : normal.  Pain is controlled with current analgesics. Medications being used: ibuprofen (OTC).  Objective:  BP 105/67 (BP Location: Right Arm, Patient Position: Sitting, Cuff Size: Normal)   Pulse 75   Ht 5\' 3"  (1.6 m)   Wt 176 lb 9.6 oz (80.1 kg)   LMP 12/19/2016 (Exact Date)   Breastfeeding? Yes   BMI 31.28 kg/m  General:Well developed, well nourished.  No acute distress. Abdomen: Bowel sounds normal, soft, non-tender.  Incision(s):   Healing beautifully, no drainage, no erythema, no hernia, no swelling, no dehiscence, ;glue in place.     Assessment:  Post-Op 1 weeks s/p cesarean   doing well Routine care postoperatively.   Plan:  1.Wound care discussed   2. . current medications.ibuprofen 3. Activity restrictions: no driving til 2+ wk , continue postop care 4. return to work: not applicable. 5. Follow up in 4 weeks.

## 2017-10-17 ENCOUNTER — Encounter: Payer: Self-pay | Admitting: Women's Health

## 2017-10-17 ENCOUNTER — Ambulatory Visit (INDEPENDENT_AMBULATORY_CARE_PROVIDER_SITE_OTHER): Payer: BLUE CROSS/BLUE SHIELD | Admitting: Women's Health

## 2017-10-17 DIAGNOSIS — Z8751 Personal history of pre-term labor: Secondary | ICD-10-CM | POA: Insufficient documentation

## 2017-10-17 DIAGNOSIS — Z98891 History of uterine scar from previous surgery: Secondary | ICD-10-CM

## 2017-10-17 NOTE — Patient Instructions (Signed)
NO SEX UNTIL AFTER YOU GET YOUR BIRTH CONTROL  

## 2017-10-17 NOTE — Progress Notes (Signed)
   POSTPARTUM VISIT Patient name: Maria HickmanBrittany N Sanpedro MRN [id number]  Date of birth: 11/10/1995 Chief Complaint:   postpartum visit (interested in Paragard)  History of Present Illness:   Maria HickmanBrittany N Griess is a 22 y.o. [phone] Caucasian female being seen today for a postpartum visit. She is 5 weeks postpartum following a primary cesarean section, low transverse incision d/t breech at 36.4 gestational weeks after PPROM. Anesthesia: spinal. I have fully reviewed the prenatal and intrapartum course. Pregnancy complicated by low lying placenta, SCH. Postpartum course has been uncomplicated. Bleeding no bleeding. Bowel function is normal. Bladder function is normal.  Patient is not sexually active. Last sexual activity: prior to birth of baby.  Contraception method is wants Paragard IUD.  [place] Postpartum Depression Screening: negative. Score 0.   Last pap 08/22/16.  Results were normal .  No LMP recorded.  Baby's course has been uncomplicated. Baby is feeding by bottle.  Review of Systems:   Pertinent items are noted in HPI Denies Abnormal vaginal discharge w/ itching/odor/irritation, headaches, visual changes, shortness of breath, chest pain, abdominal pain, severe nausea/vomiting, or problems with urination or bowel movements. Pertinent History Reviewed:  Reviewed past medical,surgical, obstetrical and family history.  Reviewed problem list, medications and allergies. OB History  Gravida Para Term Preterm AB Living  2 1   1 1 1   SAB TAB Ectopic Multiple Live Births  1     0 1    # Outcome Date GA Lbr Len/2nd Weight Sex Delivery Anes PTL Lv  2 Preterm 09/10/17 [redacted]w[redacted]d  7 lb 13 oz (3.545 kg) M CS-LTranv Spinal  LIV  1 SAB 11/05/16 5874w6d      N FD     Complications: Trisomy 18   Physical Assessment:   Vitals:   10/17/17 1214  BP: 110/79  Pulse: 64  Weight: 178 lb (80.7 kg)  Height: 5\' 3"  (1.6 m)  Body mass index is 31.53 kg/m.       Physical Examination:   General appearance:  alert, well appearing, and in no distress  Mental status: alert, oriented to person, place, and time  Skin: warm & dry   Cardiovascular: normal heart rate noted   Respiratory: normal respiratory effort, no distress   Breasts: deferred, no complaints   Abdomen: soft, non-tender, c/s incision well-healed   Pelvic: VULVA: normal appearing vulva with no masses, tenderness or lesions, UTERUS: uterus is normal size, shape, consistency and nontender  Rectal: no hemorrhoids  Extremities: no edema       No results found for this or any previous visit (from the past 24 hour(s)).  Assessment & Plan:  1) Postpartum exam 2) 5 wks s/p PLTCS d/t breech PPROM 3) Bottlefeeding 4) Depression screening 5) Contraception counseling, pt prefers abstinence until Paragard insertion  Meds: No orders of the defined types were placed in this encounter.   Follow-up: Return in about 3 weeks (around 11/07/2017) for Paragard IUD insertion.   No orders of the defined types were placed in this encounter.   Cheral MarkerKimberly R Asbury Hair CNM, [facility]WHNP-BC 10/17/2017 1:24 PM

## 2017-11-06 ENCOUNTER — Ambulatory Visit (INDEPENDENT_AMBULATORY_CARE_PROVIDER_SITE_OTHER): Payer: BLUE CROSS/BLUE SHIELD | Admitting: Advanced Practice Midwife

## 2017-11-06 ENCOUNTER — Encounter: Payer: Self-pay | Admitting: Advanced Practice Midwife

## 2017-11-06 VITALS — BP 117/75 | HR 75

## 2017-11-06 DIAGNOSIS — Z3202 Encounter for pregnancy test, result negative: Secondary | ICD-10-CM

## 2017-11-06 DIAGNOSIS — Z30430 Encounter for insertion of intrauterine contraceptive device: Secondary | ICD-10-CM

## 2017-11-06 LAB — POCT URINE PREGNANCY: Preg Test, Ur: NEGATIVE

## 2017-11-06 MED ORDER — PARAGARD INTRAUTERINE COPPER IU IUD
1.0000 | INTRAUTERINE_SYSTEM | Freq: Once | INTRAUTERINE | Status: AC
  Administered 2017-11-06: 1 via INTRAUTERINE

## 2017-11-06 MED ORDER — LEVONORGESTREL 19.5 MCG/DAY IU IUD: INTRAUTERINE_SYSTEM | Freq: Once | INTRAUTERINE | Status: DC

## 2017-11-06 NOTE — Progress Notes (Signed)
Maria Serrano is a 22 y.o. year old  female   who presents for placement of a paragard IUD. Her LMP was yesterday and her pregnancy test today is negative.    The risks and benefits of the method and placement have been thouroughly reviewed with the patient and all questions were answered.  Specifically the patient is aware of failure rate of 03/998, expulsion of the IUD and of possible perforation.  The patient is aware of irregular bleeding due to the method and understands the incidence of irregular bleeding diminishes with time.  Time out was performed.  A Graves speculum was placed.  The cervix was prepped using Betadine. The uterus was found to be neutral and it sounded to 8 cm.  The cervix was grasped with a tenaculum and the IUD was inserted to 8 cm.  It was pulled back 1 cm and the IUD was disengaged.  The strings were trimmed to 3 cm.  Sonogram was performed and the proper placement of the IUD was verified.  The patient was instructed on signs and symptoms of infection and to check for the strings after each menses or each month.  The patient is to refrain from intercourse for 3 days.  The patient is scheduled for a return appointment after her first menses or 4 weeks.  Jacklyn ShellFrances Cresenzo-Dishmon 11/06/2017 3:31 PM

## 2017-11-06 NOTE — Addendum Note (Signed)
Addended by: Moss McRESENZO, Ziggy Chanthavong M on: 11/06/2017 04:06 PM   Modules accepted: Orders

## 2017-12-09 ENCOUNTER — Ambulatory Visit: Payer: BLUE CROSS/BLUE SHIELD | Admitting: Advanced Practice Midwife

## 2017-12-10 ENCOUNTER — Encounter: Payer: Self-pay | Admitting: Advanced Practice Midwife

## 2017-12-10 ENCOUNTER — Ambulatory Visit (INDEPENDENT_AMBULATORY_CARE_PROVIDER_SITE_OTHER): Payer: BLUE CROSS/BLUE SHIELD | Admitting: Advanced Practice Midwife

## 2017-12-10 VITALS — BP 111/75 | HR 74 | Ht 63.0 in | Wt 175.5 lb

## 2017-12-10 DIAGNOSIS — Z30431 Encounter for routine checking of intrauterine contraceptive device: Secondary | ICD-10-CM

## 2017-12-10 NOTE — Progress Notes (Signed)
History:  22 y.o. G2P0111 here today for today for IUD string check; Paragard IUD was placed  A month ago. No complaints about the IUD, no concerning side effects.  The following portions of the patient's history were reviewed and updated as appropriate: allergies, current medications, past family history, past medical history, past social history, past surgical history and problem list.  Review of Systems:   Constitutional: Negative for fever and chills Eyes: Negative for visual disturbances Respiratory: Negative for shortness of breath, dyspnea Cardiovascular: Negative for chest pain or palpitations  Gastrointestinal: Negative for vomiting, diarrhea and constipation Genitourinary: Negative for dysuria and urgency Musculoskeletal: Negative for back pain, joint pain, myalgias  Neurological: Negative for dizziness and headaches    Objective:  Physical Exam Blood pressure 111/75, pulse 74, height 5\' 3"  (1.6 m), weight 175 lb 8 oz (79.6 kg), last menstrual period 12/09/2017, not currently breastfeeding. Gen: NAD Abd: Soft, nontender and nondistended Pelvic: Bedside US reveals properly place IUD. Reveals properly placed IUD  Assessment & Plan:  Normal IUD check. Patient to keep IUD in place for 10 years; can come in for removal if she desires pregnancy.

## 2017-12-29 ENCOUNTER — Ambulatory Visit (INDEPENDENT_AMBULATORY_CARE_PROVIDER_SITE_OTHER): Payer: BLUE CROSS/BLUE SHIELD | Admitting: Women's Health

## 2017-12-29 ENCOUNTER — Encounter: Payer: Self-pay | Admitting: Women's Health

## 2017-12-29 VITALS — BP 115/74 | HR 82 | Ht 63.0 in | Wt 180.4 lb

## 2017-12-29 DIAGNOSIS — B9689 Other specified bacterial agents as the cause of diseases classified elsewhere: Secondary | ICD-10-CM

## 2017-12-29 DIAGNOSIS — N898 Other specified noninflammatory disorders of vagina: Secondary | ICD-10-CM

## 2017-12-29 DIAGNOSIS — N760 Acute vaginitis: Secondary | ICD-10-CM | POA: Diagnosis not present

## 2017-12-29 LAB — POCT WET PREP (WET MOUNT)
Clue Cells Wet Prep Whiff POC: POSITIVE
Trichomonas Wet Prep HPF POC: ABSENT

## 2017-12-29 MED ORDER — METRONIDAZOLE 500 MG PO TABS: 500.0000 mg | ORAL_TABLET | Freq: Two times a day (BID) | ORAL | 0 refills | Status: DC

## 2017-12-29 NOTE — Patient Instructions (Signed)
Bacterial Vaginosis Bacterial vaginosis is a vaginal infection that occurs when the normal balance of bacteria in the vagina is disrupted. It results from an overgrowth of certain bacteria. This is the most common vaginal infection among women ages 15-44. Because bacterial vaginosis increases your risk for STIs (sexually transmitted infections), getting treated can help reduce your risk for chlamydia, gonorrhea, herpes, and HIV (human immunodeficiency virus). Treatment is also important for preventing complications in pregnant women, because this condition can cause an early (premature) delivery. What are the causes? This condition is caused by an increase in harmful bacteria that are normally present in small amounts in the vagina. However, the reason that the condition develops is not fully understood. What increases the risk? The following factors may make you more likely to develop this condition:  Having a new sexual partner or multiple sexual partners.  Having unprotected sex.  Douching.  Having an intrauterine device (IUD).  Smoking.  Drug and alcohol abuse.  Taking certain antibiotic medicines.  Being pregnant.  You cannot get bacterial vaginosis from toilet seats, bedding, swimming pools, or contact with objects around you. What are the signs or symptoms? Symptoms of this condition include:  Grey or white vaginal discharge. The discharge can also be watery or foamy.  A fish-like odor with discharge, especially after sexual intercourse or during menstruation.  Itching in and around the vagina.  Burning or pain with urination.  Some women with bacterial vaginosis have no signs or symptoms. How is this diagnosed? This condition is diagnosed based on:  Your medical history.  A physical exam of the vagina.  Testing a sample of vaginal fluid under a microscope to look for a large amount of bad bacteria or abnormal cells. Your health care provider may use a cotton swab  or a small wooden spatula to collect the sample.  How is this treated? This condition is treated with antibiotics. These may be given as a pill, a vaginal cream, or a medicine that is put into the vagina (suppository). If the condition comes back after treatment, a second round of antibiotics may be needed. Follow these instructions at home: Medicines  Take over-the-counter and prescription medicines only as told by your health care provider.  Take or use your antibiotic as told by your health care provider. Do not stop taking or using the antibiotic even if you start to feel better. General instructions  If you have a female sexual partner, tell her that you have a vaginal infection. She should see her health care provider and be treated if she has symptoms. If you have a female sexual partner, he does not need treatment.  During treatment: ? Avoid sexual activity until you finish treatment. ? Do not douche. ? Avoid alcohol as directed by your health care provider. ? Avoid breastfeeding as directed by your health care provider.  Drink enough water and fluids to keep your urine clear or pale yellow.  Keep the area around your vagina and rectum clean. ? Wash the area daily with warm water. ? Wipe yourself from front to back after using the toilet.  Keep all follow-up visits as told by your health care provider. This is important. How is this prevented?  Do not douche.  Wash the outside of your vagina with warm water only.  Use protection when having sex. This includes latex condoms and dental dams.  Limit how many sexual partners you have. To help prevent bacterial vaginosis, it is best to have sex with just   one partner (monogamous).  Make sure you and your sexual partner are tested for STIs.  Wear cotton or cotton-lined underwear.  Avoid wearing tight pants and pantyhose, especially during summer.  Limit the amount of alcohol that you drink.  Do not use any products that  contain nicotine or tobacco, such as cigarettes and e-cigarettes. If you need help quitting, ask your health care provider.  Do not use illegal drugs. Where to find more information:  Centers for Disease Control and Prevention: www.cdc.gov/std  American Sexual Health Association (ASHA): www.ashastd.org  U.S. Department of Health and Human Services, Office on Women's Health: www.womenshealth.gov/ or https://www.womenshealth.gov/a-z-topics/bacterial-vaginosis Contact a health care provider if:  Your symptoms do not improve, even after treatment.  You have more discharge or pain when urinating.  You have a fever.  You have pain in your abdomen.  You have pain during sex.  You have vaginal bleeding between periods. Summary  Bacterial vaginosis is a vaginal infection that occurs when the normal balance of bacteria in the vagina is disrupted.  Because bacterial vaginosis increases your risk for STIs (sexually transmitted infections), getting treated can help reduce your risk for chlamydia, gonorrhea, herpes, and HIV (human immunodeficiency virus). Treatment is also important for preventing complications in pregnant women, because the condition can cause an early (premature) delivery.  This condition is treated with antibiotic medicines. These may be given as a pill, a vaginal cream, or a medicine that is put into the vagina (suppository). This information is not intended to replace advice given to you by your health care provider. Make sure you discuss any questions you have with your health care provider. Document Released: 03/11/2005 Document Revised: 07/15/2016 Document Reviewed: 11/25/2015 Elsevier Interactive Patient Education  2018 Elsevier Inc.  

## 2017-12-29 NOTE — Progress Notes (Signed)
   GYN VISIT Patient name: Maria Serrano MRN [id number]  Date of birth: [birth year] Chief Complaint:   Vaginal Discharge (x week or two) and Vaginal Itching  History of Present Illness:   Maria Serrano is a 22 y.o. [phone] Caucasian female being seen today for report of vulvar irritation and malodorous d/c x 1wk, thought it was yeast so used otc yeast cream which did not work.     Patient's last menstrual period was 12/09/2017. The current method of family planning is Paragard IUD. Last pap 08/22/16. Results were:  normal Review of Systems:   Pertinent items are noted in HPI Denies fever/chills, dizziness, headaches, visual disturbances, fatigue, shortness of breath, chest pain, abdominal pain, vomiting, abnormal vaginal discharge/itching/odor/irritation, problems with periods, bowel movements, urination, or intercourse unless otherwise stated above.  Pertinent History Reviewed:  Reviewed past medical,surgical, social, obstetrical and family history.  Reviewed problem list, medications and allergies. Physical Assessment:   Vitals:   12/29/17 1619  BP: 115/74  Pulse: 82  Weight: 180 lb 6.4 oz (81.8 kg)  Height: 5\' 3"  (1.6 m)  Body mass index is 31.96 kg/m.       Physical Examination:   General appearance: alert, well appearing, and in no distress  Mental status: alert, oriented to person, place, and time  Skin: warm & dry   Cardiovascular: normal heart rate noted  Respiratory: normal respiratory effort, no distress  Abdomen: soft, non-tender   Pelvic: VULVA: normal appearing vulva with no masses, tenderness or lesions, VAGINA: normal appearing vagina with normal color and small amt malodorou discharge, no lesions, CERVIX: normal appearing cervix without discharge or lesions, IUD strings visible  Extremities: no edema   Results for orders placed or performed in visit on 12/29/17 (from the past 24 hour(s))  POCT Wet Prep [facility])   Collection Time: 12/29/17  4:54 PM    Result Value Ref Range   Source Wet Prep POC vaginal    WBC, Wet Prep HPF POC few    Bacteria Wet Prep HPF POC Few Few   BACTERIA WET PREP MORPHOLOGY POC     Clue Cells Wet Prep HPF POC Moderate (A) None   Clue Cells Wet Prep Whiff POC Positive Whiff    Yeast Wet Prep HPF POC None None   KOH Wet Prep POC     Trichomonas Wet Prep HPF POC Absent Absent    Assessment & Plan:  1) BV> Rx metronidazole 500mg  BID x 7d for BV, no sex or etoh while taking   Meds:  Meds ordered this encounter  Medications  . metroNIDAZOLE (FLAGYL) 500 MG tablet    Sig: Take 1 tablet (500 mg total) by mouth 2 (two) times daily.    Dispense:  14 tablet    Refill:  0    Order Specific Question:   Supervising Provider    Answer:   Duane Lope H [[id number]]    Orders Placed This Encounter  Procedures  . POCT Wet Prep [facility])    Return in about 1 year (around 12/30/2018) for Physical.  Cheral Marker CNM, [facility] 12/29/2017 4:55 PM

## 2018-01-21 ENCOUNTER — Other Ambulatory Visit: Payer: Self-pay

## 2018-01-21 ENCOUNTER — Ambulatory Visit (INDEPENDENT_AMBULATORY_CARE_PROVIDER_SITE_OTHER): Payer: BLUE CROSS/BLUE SHIELD | Admitting: Women's Health

## 2018-01-21 ENCOUNTER — Encounter: Payer: Self-pay | Admitting: Women's Health

## 2018-01-21 VITALS — BP 120/75 | HR 83 | Ht 64.0 in | Wt 183.0 lb

## 2018-01-21 DIAGNOSIS — N898 Other specified noninflammatory disorders of vagina: Secondary | ICD-10-CM

## 2018-01-21 LAB — POCT WET PREP (WET MOUNT)
CLUE CELLS WET PREP WHIFF POC: NEGATIVE
TRICHOMONAS WET PREP HPF POC: ABSENT

## 2018-01-21 NOTE — Progress Notes (Signed)
   GYN VISIT Patient name: Maria Serrano MRN [id number]  Date of birth: 01/05/1996 Chief Complaint:   Vaginal Discharge (with itching)  History of Present Illness:   Maria Serrano is a 22 y.o. [phone] Caucasian female being seen today for report of vaginal d/c w/ itching x few days, stopped today.  Seems to happen after periods. Doesn't know if related to tampons. Was tx for BV earlier in months, sx completely resolved.   Patient's last menstrual period was 01/14/2018. The current method of family planning is IUD. Last pap 08/22/16. Results were:  normal Review of Systems:   Pertinent items are noted in HPI Denies fever/chills, dizziness, headaches, visual disturbances, fatigue, shortness of breath, chest pain, abdominal pain, vomiting, abnormal vaginal discharge/itching/odor/irritation, problems with periods, bowel movements, urination, or intercourse unless otherwise stated above.  Pertinent History Reviewed:  Reviewed past medical,surgical, social, obstetrical and family history.  Reviewed problem list, medications and allergies. Physical Assessment:   Vitals:   01/21/18 1632  BP: 120/75  Pulse: 83  Weight: 183 lb (83 kg)  Height: 5\' 4"  (1.626 m)  Body mass index is 31.41 kg/m.       Physical Examination:   General appearance: alert, well appearing, and in no distress  Mental status: alert, oriented to person, place, and time  Skin: warm & dry   Cardiovascular: normal heart rate noted  Respiratory: normal respiratory effort, no distress  Abdomen: soft, non-tender   Pelvic: VULVA: normal appearing vulva with no masses, tenderness or lesions, VAGINA: normal appearing vagina with normal color and discharge, no lesions, CERVIX: normal appearing cervix without discharge or lesions, IUD strings visible  Extremities: no edema   Results for orders placed or performed in visit on 01/21/18 (from the past 24 hour(s))  POCT Wet Prep Mellody [place])   Collection Time: 01/21/18  4:57  PM  Result Value Ref Range   Source Wet Prep POC vaginal    WBC, Wet Prep HPF POC few    Bacteria Wet Prep HPF POC Few Few   BACTERIA WET PREP MORPHOLOGY POC     Clue Cells Wet Prep HPF POC None None   Clue Cells Wet Prep Whiff POC Negative Whiff    Yeast Wet Prep HPF POC None None   KOH Wet Prep POC     Trichomonas Wet Prep HPF POC Absent Absent    Assessment & Plan:  1) Vaginal d/c w/ itching> now resolved, wet prep normal, will send gc/ct. Can try diva cup (thinks it may be tampon related). If still happening every time w/ periods let me know, will rx metrogel  Meds: No orders of the defined types were placed in this encounter.   Orders Placed This Encounter  Procedures  . GC/Chlamydia Probe Amp  . POCT Wet Prep Mellody Drown [place])    Return for Oct 2020 for physical.  Cheral Marker CNM, [facility] 01/21/2018 4:58 PM

## 2018-01-23 LAB — GC/CHLAMYDIA PROBE AMP
Chlamydia trachomatis, NAA: NEGATIVE
Neisseria gonorrhoeae by PCR: NEGATIVE

## 2018-03-20 ENCOUNTER — Ambulatory Visit (INDEPENDENT_AMBULATORY_CARE_PROVIDER_SITE_OTHER): Payer: BLUE CROSS/BLUE SHIELD | Admitting: Women's Health

## 2018-03-20 ENCOUNTER — Encounter: Payer: Self-pay | Admitting: Women's Health

## 2018-03-20 VITALS — BP 112/76 | HR 78 | Ht 63.0 in | Wt 173.0 lb

## 2018-03-20 DIAGNOSIS — Z113 Encounter for screening for infections with a predominantly sexual mode of transmission: Secondary | ICD-10-CM | POA: Diagnosis not present

## 2018-03-20 DIAGNOSIS — N3001 Acute cystitis with hematuria: Secondary | ICD-10-CM

## 2018-03-20 DIAGNOSIS — Z1389 Encounter for screening for other disorder: Secondary | ICD-10-CM

## 2018-03-20 LAB — POCT URINALYSIS DIPSTICK OB
Blood, UA: 3
GLUCOSE, UA: NEGATIVE
KETONES UA: NEGATIVE
POC,PROTEIN,UA: NEGATIVE

## 2018-03-20 MED ORDER — SULFAMETHOXAZOLE-TRIMETHOPRIM 800-160 MG PO TABS: 1.0000 | ORAL_TABLET | Freq: Two times a day (BID) | ORAL | 0 refills | Status: DC

## 2018-03-20 NOTE — Progress Notes (Signed)
   GYN VISIT Patient name: Maria Serrano MRN [id number]  Date of birth: [birth year] Chief Complaint:   burning when pee  History of Present Illness:   Maria Serrano is a 22 y.o. [phone] Caucasian female being seen today for report of urinary frequency, not emptying bladder, dysuria x ~5d. Has new sexual partner, has been having sex 'a lot'.      Patient's last menstrual period was 03/17/2018. The current method of family planning is IUD. Last pap 08/22/16. Results were:  normal Review of Systems:   Pertinent items are noted in HPI Denies fever/chills, dizziness, headaches, visual disturbances, fatigue, shortness of breath, chest pain, abdominal pain, vomiting, abnormal vaginal discharge/itching/odor/irritation, problems with periods, bowel movements, urination, or intercourse unless otherwise stated above.  Pertinent History Reviewed:  Reviewed past medical,surgical, social, obstetrical and family history.  Reviewed problem list, medications and allergies. Physical Assessment:   Vitals:   03/20/18 1208  BP: 112/76  Pulse: 78  Weight: 173 lb (78.5 kg)  Height: 5\' 3"  (1.6 m)  Body mass index is 30.65 kg/m.       Physical Examination:   General appearance: alert, well appearing, and in no distress  Mental status: alert, oriented to person, place, and time  Skin: warm & dry   Cardiovascular: normal heart rate noted  Respiratory: normal respiratory effort, no distress  Abdomen: soft, non-tender   Pelvic: examination not indicated  Extremities: no edema   Results for orders placed or performed in visit on 03/20/18 (from the past 24 hour(s))  POC Urinalysis Dipstick OB   Collection Time: 03/20/18 12:16 PM  Result Value Ref Range   Color, UA     Clarity, UA     Glucose, UA Negative Negative   Bilirubin, UA     Ketones, UA neg    Spec Grav, UA     Blood, UA 3    pH, UA     POC,PROTEIN,UA Negative Negative, Trace, Small (1+), Moderate (2+), Large (3+), 4+    Urobilinogen, UA     Nitrite, UA postive    Leukocytes, UA Moderate (2+) (A) Negative   Appearance     Odor      Assessment & Plan:  1) UTI> 'honeymoon cystitis' rx septra, send urine cx, make sure to void after sex, wipe front to back  2) STD screen> gc/ct d/t new sex partner  Meds:  Meds ordered this encounter  Medications  . sulfamethoxazole-trimethoprim (BACTRIM DS,SEPTRA DS) 800-160 MG tablet    Sig: Take 1 tablet by mouth 2 (two) times daily. X 7 days    Dispense:  14 tablet    Refill:  0    Order Specific Question:   Supervising Provider    Answer:   Duane LopeEURE, LUTHER H [[id number]]    Orders Placed This Encounter  Procedures  . Urine Culture  . GC/Chlamydia Probe Amp  . POC Urinalysis Dipstick OB    Return for after 5/31 for physical.  Cheral MarkerKimberly R Booker CNM, [facility] 03/20/2018 12:22 PM

## 2018-03-21 ENCOUNTER — Emergency Department (HOSPITAL_COMMUNITY)
Admission: EM | Admit: 2018-03-21 | Discharge: 2018-03-21 | Disposition: A | Discharge status: Home / Self Care | Payer: BLUE CROSS/BLUE SHIELD | Attending: Emergency Medicine | Admitting: Emergency Medicine

## 2018-03-21 ENCOUNTER — Encounter (HOSPITAL_COMMUNITY): Payer: Self-pay | Admitting: Emergency Medicine

## 2018-03-21 ENCOUNTER — Other Ambulatory Visit: Payer: Self-pay

## 2018-03-21 DIAGNOSIS — R42 Dizziness and giddiness: Secondary | ICD-10-CM | POA: Diagnosis not present

## 2018-03-21 DIAGNOSIS — N939 Abnormal uterine and vaginal bleeding, unspecified: Secondary | ICD-10-CM | POA: Diagnosis not present

## 2018-03-21 DIAGNOSIS — N946 Dysmenorrhea, unspecified: Secondary | ICD-10-CM | POA: Insufficient documentation

## 2018-03-21 DIAGNOSIS — Z87891 Personal history of nicotine dependence: Secondary | ICD-10-CM | POA: Diagnosis not present

## 2018-03-21 DIAGNOSIS — R300 Dysuria: Secondary | ICD-10-CM | POA: Diagnosis not present

## 2018-03-21 LAB — CBC WITH DIFFERENTIAL/PLATELET
Abs Immature Granulocytes: 0.02 10*3/uL (ref 0.00–0.07)
Basophils Absolute: 0 10*3/uL (ref 0.0–0.1)
Basophils Relative: 0 %
Eosinophils Absolute: 0.1 10*3/uL (ref 0.0–0.5)
Eosinophils Relative: 1 %
HEMATOCRIT: 45.2 % (ref 36.0–46.0)
Hemoglobin: 14.6 g/dL (ref 12.0–15.0)
Immature Granulocytes: 0 %
Lymphocytes Relative: 21 %
Lymphs Abs: 2 10*3/uL (ref 0.7–4.0)
MCH: 30.8 pg (ref 26.0–34.0)
MCHC: 32.3 g/dL (ref 30.0–36.0)
MCV: 95.4 fL (ref 80.0–100.0)
Monocytes Absolute: 0.6 10*3/uL (ref 0.1–1.0)
Monocytes Relative: 6 %
Neutro Abs: 6.7 10*3/uL (ref 1.7–7.7)
Neutrophils Relative %: 72 %
Platelets: 290 10*3/uL (ref 150–400)
RBC: 4.74 MIL/uL (ref 3.87–5.11)
RDW: 12.6 % (ref 11.5–15.5)
WBC: 9.5 10*3/uL (ref 4.0–10.5)
nRBC: 0 % (ref 0.0–0.2)

## 2018-03-21 LAB — HCG, QUANTITATIVE, PREGNANCY: hCG, Beta Chain, Quant, S: <1 m[IU]/mL (ref <5)

## 2018-03-21 NOTE — ED Notes (Signed)
Pt states while waiting she has taken her tampon out once and not put on a sanitary napkin or another tampon. States "I don't feel like I'm bleeding".

## 2018-03-21 NOTE — ED Triage Notes (Addendum)
Patient c/o heavy vaginal bleeding x2 days. Per patient bleeding trough tampon every 15 minutes. Per patient has IUD and period are regular. Patient states "period was a week late and her flow is usually not heavy." Per patient light pink spotting started 12/24 before heavy bleeding began. Patient states starting to have abd cramping and some dizziness.

## 2018-03-21 NOTE — ED Provider Notes (Signed)
[facility] Provider Note   CSN: [id number] Arrival date & time: 03/21/18  1519     History   Chief Complaint Chief Complaint  Patient presents with  . Vaginal Bleeding    HPI Maria Serrano is a 22 y.o. female who is currently 6 months postpartum presenting with an extremely heavy and painful.  Which started 2 days ago as mild spotting and she woke this morning around 3 AM with pelvic cramping and heavy bleeding.  She describes having to change her tampon every 15 minutes today, this lasted for more than 2 hours, but has nearly resolved by the time of arrival here.  Of note, she was seen by her gynecologist yesterday for STD screening as she has a new partner, and was diagnosed with a UTI (also had dysuria) and has had 3 doses of Bactrim with improvement in the dysuria symptoms.  She is currently symptom-free, no pelvic pain and she checked her tampon after arrival here and she is no longer having heavy bleeding.  She is concerned that there may be a problem with the position of her IUD and wants to get checked.  She felt dizzy early this morning, the symptom has resolved.  The history is provided by the patient.    Past Medical History:  Diagnosis Date  . Medical history non-contributory     Patient Active Problem List   Diagnosis Date Noted  . Encounter for IUD insertion 11/06/2017  . History of preterm delivery 10/17/2017  . Status post primary low transverse cesarean section 09/10/2017  . History of trisomy 618 1st child 03/13/2017    Past Surgical History:  Procedure Laterality Date  . CESAREAN SECTION N/A 09/10/2017   Procedure: CESAREAN SECTION;  Surgeon: Lazaro ArmsEure, Luther H, MD;  [facility];  Service: Obstetrics;  Laterality: N/A;  . DILATION AND CURETTAGE OF UTERUS       OB History    Gravida  2   Para  1   Term      Preterm  1   AB  1   Living  1     SAB  1   TAB      Ectopic      Multiple  0   Live Births   1            Home Medications    Prior to Admission medications   Medication Sig Start Date End Date Taking? Authorizing Provider  PARAGARD INTRAUTERINE COPPER IU by Intrauterine route.    [provider]  Prenatal Vit-Fe Fumarate-FA (PRENATAL VITAMIN PO) Take by mouth daily.    [provider]  sulfamethoxazole-trimethoprim (BACTRIM DS,SEPTRA DS) 800-160 MG tablet Take 1 tablet by mouth 2 (two) times daily. X 7 days 03/20/18   Cheral MarkerBooker, Kimberly R, CNM    Family History Family History  Problem Relation Age of Onset  . COPD Maternal Grandfather   . Cancer Maternal Grandfather        lung  . Hypertension Father   . Factor V Leiden deficiency Father   . Endometriosis Maternal Aunt   . Club foot Son     Social History Social History   Tobacco Use  . Smoking status: Former Smoker    Years: 0.50    Types: Cigarettes  . Smokeless tobacco: Never Used  Substance Use Topics  . Alcohol use: No  . Drug use: No     Allergies   Amoxicillin   Review of Systems  Review of Systems  Constitutional: Negative for fever.  HENT: Negative for congestion and sore throat.   Eyes: Negative.   Respiratory: Negative for chest tightness and shortness of breath.   Cardiovascular: Negative for chest pain.  Gastrointestinal: Negative for abdominal pain, nausea and vomiting.  Genitourinary: Positive for dysuria, vaginal bleeding and vaginal pain.  Musculoskeletal: Negative for arthralgias, joint swelling and neck pain.  Skin: Negative.  Negative for rash and wound.  Neurological: Negative for dizziness, weakness, light-headedness, numbness and headaches.  Psychiatric/Behavioral: Negative.      Physical Exam Updated Vital Signs BP 129/76 (BP Location: Right Arm)   Pulse 81   Temp 98.1 F (36.7 C) (Oral)   Resp 18   Ht 5\' 3"  (1.6 m)   Wt 79.4 kg   LMP 03/17/2018   SpO2 100%   BMI 31.00 kg/m   Physical Exam Vitals signs and nursing note reviewed. Exam  conducted with a chaperone present.  Constitutional:      Appearance: She is well-developed.  HENT:     Head: Normocephalic and atraumatic.  Eyes:     Conjunctiva/sclera: Conjunctivae normal.  Neck:     Musculoskeletal: Normal range of motion.  Cardiovascular:     Rate and Rhythm: Normal rate and regular rhythm.     Heart sounds: Normal heart sounds.  Pulmonary:     Effort: Pulmonary effort is normal.     Breath sounds: Normal breath sounds. No wheezing.  Abdominal:     General: Bowel sounds are normal. There is no distension.     Palpations: Abdomen is soft.     Tenderness: There is no abdominal tenderness. There is no guarding.  Genitourinary:    Cervix: Normal.     Comments: IUD string in place. Small amount of blood in vagina. Musculoskeletal: Normal range of motion.  Skin:    General: Skin is warm and dry.  Neurological:     Mental Status: She is alert.      ED Treatments / Results  Labs (all labs ordered are listed, but only abnormal results are displayed) Labs Reviewed  CBC WITH DIFFERENTIAL/PLATELET  HCG, QUANTITATIVE, PREGNANCY    EKG None  Radiology No results found.  Procedures Procedures (including critical care time)  Medications Ordered in ED Medications - No data to display   Initial Impression / Assessment and Plan / ED Course  I have reviewed the triage vital signs and the nursing notes.  Pertinent labs & imaging results that were available during my care of the patient were reviewed by me and considered in my medical decision making (see chart for details).     Pt with transient dysmenorrhea, which appears to be resolved at time of exam.  Reassurance given.  Labs reviewed and discussed with pt, no anemia. She is not pregnant.  Encouraged f/u with [facility] prn.  Also discussed return precautions.   Final Clinical Impressions(s) / ED Diagnoses   Final diagnoses:  Dysmenorrhea, unspecified    ED Discharge Orders    None         Victoriano Laindol, Brayli Klingbeil, PA-C 03/21/18 Watt Climes1958    Lockwood, Robert, MD 03/21/18 2351

## 2018-03-22 LAB — URINE CULTURE

## 2018-03-22 LAB — GC/CHLAMYDIA PROBE AMP
Chlamydia trachomatis, NAA: NEGATIVE
NEISSERIA GONORRHOEAE BY PCR: NEGATIVE

## 2018-04-14 ENCOUNTER — Encounter: Payer: Self-pay | Admitting: Women's Health

## 2018-04-14 ENCOUNTER — Ambulatory Visit (INDEPENDENT_AMBULATORY_CARE_PROVIDER_SITE_OTHER): Payer: Medicaid Other | Admitting: Women's Health

## 2018-04-14 VITALS — BP 109/71 | HR 81 | Ht 64.0 in | Wt 169.5 lb

## 2018-04-14 DIAGNOSIS — Z30432 Encounter for removal of intrauterine contraceptive device: Secondary | ICD-10-CM

## 2018-04-14 NOTE — Progress Notes (Signed)
   IUD REMOVAL  Patient name: Maria Serrano MRN [id number]  Date of birth: 08-05-1995 Subjective Findings:   Maria Serrano is a 23 y.o. [phone] Caucasian female being seen today for removal of a Paragard IUD. Her IUD was placed 11/06/17.  She desires removal because of heavy/painful periods since getting IUD. Went to ED 03/21/18 for heavy period, was bleeding through tampon and pad q . Signed copy of informed consent in chart.   Patient's last menstrual period was 03/17/2018. Last pap 08/22/16. Results were:  normal The planned method of family planning is condoms until she decides what she wants Pertinent History Reviewed:   Reviewed past medical,surgical, social, obstetrical and family history.  Reviewed problem list, medications and allergies. Objective Findings & Procedure:    Vitals:   04/14/18 0840  BP: 109/71  Pulse: 81  Weight: 169 lb 8 oz (76.9 kg)  Height: 5\' 4"  (1.626 m)  Body mass index is 29.09 kg/m.  No results found for this or any previous visit (from the past 24 hour(s)).   Time out was performed.  A graves speculum was placed in the vagina.  The cervix was visualized, and the strings were visible. They were grasped and the Paragard IUD was easily removed intact without complications. The patient tolerated the procedure well.  Assessment & Plan:   1) Paragard IUD removal Follow-up prn problems  2) Contraception counseling> discussed all options, will call us when she decides. If depo/IUD/Nexp- must be on period or 14d from last sex w/ hcg am first. Condoms for now  No orders of the defined types were placed in this encounter.   Follow-up: Return for prn.  Cheral Marker CNM, [facility] 04/14/2018 9:10 AM

## 2018-05-25 ENCOUNTER — Encounter: Payer: Self-pay | Admitting: Adult Health

## 2018-05-25 ENCOUNTER — Ambulatory Visit: Payer: Self-pay | Admitting: Adult Health

## 2018-05-25 ENCOUNTER — Other Ambulatory Visit: Payer: Self-pay

## 2018-05-25 VITALS — BP 120/79 | HR 75 | Ht 60.0 in | Wt 170.0 lb

## 2018-05-25 DIAGNOSIS — O09891 Supervision of other high risk pregnancies, first trimester: Secondary | ICD-10-CM

## 2018-05-25 DIAGNOSIS — O3680X0 Pregnancy with inconclusive fetal viability, not applicable or unspecified: Secondary | ICD-10-CM

## 2018-05-25 DIAGNOSIS — Z3201 Encounter for pregnancy test, result positive: Secondary | ICD-10-CM

## 2018-05-25 DIAGNOSIS — N926 Irregular menstruation, unspecified: Secondary | ICD-10-CM

## 2018-05-25 DIAGNOSIS — Z3490 Encounter for supervision of normal pregnancy, unspecified, unspecified trimester: Secondary | ICD-10-CM | POA: Diagnosis not present

## 2018-05-25 LAB — POCT URINE PREGNANCY: Preg Test, Ur: POSITIVE — AB

## 2018-05-25 NOTE — Patient Instructions (Signed)
First Trimester of Pregnancy  The first trimester of pregnancy is from week 1 until the end of week 13 (months 1 through 3). A week after a sperm fertilizes an egg, the egg will implant on the wall of the uterus. This embryo will begin to develop into a baby. Genes from you and your partner will form the baby. The female genes will determine whether the baby will be a boy or a girl. At 6-8 weeks, the eyes and face will be formed, and the heartbeat can be seen on ultrasound. At the end of 12 weeks, all the baby's organs will be formed.  Now that you are pregnant, you will want to do everything you can to have a healthy baby. Two of the most important things are to get good prenatal care and to follow your health care provider's instructions. Prenatal care is all the medical care you receive before the baby's birth. This care will help prevent, find, and treat any problems during the pregnancy and childbirth.  Body changes during your first trimester  Your body goes through many changes during pregnancy. The changes vary from woman to woman.   You may gain or lose a couple of pounds at first.   You may feel sick to your stomach (nauseous) and you may throw up (vomit). If the vomiting is uncontrollable, call your health care provider.   You may tire easily.   You may develop headaches that can be relieved by medicines. All medicines should be approved by your health care provider.   You may urinate more often. Painful urination may mean you have a bladder infection.   You may develop heartburn as a result of your pregnancy.   You may develop constipation because certain hormones are causing the muscles that push stool through your intestines to slow down.   You may develop hemorrhoids or swollen veins (varicose veins).   Your breasts may begin to grow larger and become tender. Your nipples may stick out more, and the tissue that surrounds them (areola) may become darker.   Your gums may bleed and may be  sensitive to brushing and flossing.   Dark spots or blotches (chloasma, mask of pregnancy) may develop on your face. This will likely fade after the baby is born.   Your menstrual periods will stop.   You may have a loss of appetite.   You may develop cravings for certain kinds of food.   You may have changes in your emotions from day to day, such as being excited to be pregnant or being concerned that something may go wrong with the pregnancy and baby.   You may have more vivid and strange dreams.   You may have changes in your hair. These can include thickening of your hair, rapid growth, and changes in texture. Some women also have hair loss during or after pregnancy, or hair that feels dry or thin. Your hair will most likely return to normal after your baby is born.  What to expect at prenatal visits  During a routine prenatal visit:   You will be weighed to make sure you and the baby are growing normally.   Your blood pressure will be taken.   Your abdomen will be measured to track your baby's growth.   The fetal heartbeat will be listened to between weeks 10 and 14 of your pregnancy.   Test results from any previous visits will be discussed.  Your health care provider may ask you:     How you are feeling.   If you are feeling the baby move.   If you have had any abnormal symptoms, such as leaking fluid, bleeding, severe headaches, or abdominal cramping.   If you are using any tobacco products, including cigarettes, chewing tobacco, and electronic cigarettes.   If you have any questions.  Other tests that may be performed during your first trimester include:   Blood tests to find your blood type and to check for the presence of any previous infections. The tests will also be used to check for low iron levels (anemia) and protein on red blood cells (Rh antibodies). Depending on your risk factors, or if you previously had diabetes during pregnancy, you may have tests to check for high blood sugar  that affects pregnant women (gestational diabetes).   Urine tests to check for infections, diabetes, or protein in the urine.   An ultrasound to confirm the proper growth and development of the baby.   Fetal screens for spinal cord problems (spina bifida) and Down syndrome.   HIV (human immunodeficiency virus) testing. Routine prenatal testing includes screening for HIV, unless you choose not to have this test.   You may need other tests to make sure you and the baby are doing well.  Follow these instructions at home:  Medicines   Follow your health care provider's instructions regarding medicine use. Specific medicines may be either safe or unsafe to take during pregnancy.   Take a prenatal vitamin that contains at least 600 micrograms (mcg) of folic acid.   If you develop constipation, try taking a stool softener if your health care provider approves.  Eating and drinking     Eat a balanced diet that includes fresh fruits and vegetables, whole grains, good sources of protein such as meat, eggs, or tofu, and low-fat dairy. Your health care provider will help you determine the amount of weight gain that is right for you.   Avoid raw meat and uncooked cheese. These carry germs that can cause birth defects in the baby.   Eating four or five small meals rather than three large meals a day may help relieve nausea and vomiting. If you start to feel nauseous, eating a few soda crackers can be helpful. Drinking liquids between meals, instead of during meals, also seems to help ease nausea and vomiting.   Limit foods that are high in fat and processed sugars, such as fried and sweet foods.   To prevent constipation:  ? Eat foods that are high in fiber, such as fresh fruits and vegetables, whole grains, and beans.  ? Drink enough fluid to keep your urine clear or pale yellow.  Activity   Exercise only as directed by your health care provider. Most women can continue their usual exercise routine during  pregnancy. Try to exercise for 30 minutes at least 5 days a week. Exercising will help you:  ? Control your weight.  ? Stay in shape.  ? Be prepared for labor and delivery.   Experiencing pain or cramping in the lower abdomen or lower back is a good sign that you should stop exercising. Check with your health care provider before continuing with normal exercises.   Try to avoid standing for long periods of time. Move your legs often if you must stand in one place for a long time.   Avoid heavy lifting.   Wear low-heeled shoes and practice good posture.   You may continue to have sex unless your health care   provider tells you not to.  Relieving pain and discomfort   Wear a good support bra to relieve breast tenderness.   Take warm sitz baths to soothe any pain or discomfort caused by hemorrhoids. Use hemorrhoid cream if your health care provider approves.   Rest with your legs elevated if you have leg cramps or low back pain.   If you develop varicose veins in your legs, wear support hose. Elevate your feet for 15 minutes, 3-4 times a day. Limit salt in your diet.  Prenatal care   Schedule your prenatal visits by the twelfth week of pregnancy. They are usually scheduled monthly at first, then more often in the last 2 months before delivery.   Write down your questions. Take them to your prenatal visits.   Keep all your prenatal visits as told by your health care provider. This is important.  Safety   Wear your seat belt at all times when driving.   Make a list of emergency phone numbers, including numbers for family, friends, the hospital, and police and fire departments.  General instructions   Ask your health care provider for a referral to a local prenatal education class. Begin classes no later than the beginning of month 6 of your pregnancy.   Ask for help if you have counseling or nutritional needs during pregnancy. Your health care provider can offer advice or refer you to specialists for help  with various needs.   Do not use hot tubs, steam rooms, or saunas.   Do not douche or use tampons or scented sanitary pads.   Do not cross your legs for long periods of time.   Avoid cat litter boxes and soil used by cats. These carry germs that can cause birth defects in the baby and possibly loss of the fetus by miscarriage or stillbirth.   Avoid all smoking, herbs, alcohol, and medicines not prescribed by your health care provider. Chemicals in these products affect the formation and growth of the baby.   Do not use any products that contain nicotine or tobacco, such as cigarettes and e-cigarettes. If you need help quitting, ask your health care provider. You may receive counseling support and other resources to help you quit.   Schedule a dentist appointment. At home, brush your teeth with a soft toothbrush and be gentle when you floss.  Contact a health care provider if:   You have dizziness.   You have mild pelvic cramps, pelvic pressure, or nagging pain in the abdominal area.   You have persistent nausea, vomiting, or diarrhea.   You have a bad smelling vaginal discharge.   You have pain when you urinate.   You notice increased swelling in your face, hands, legs, or ankles.   You are exposed to fifth disease or chickenpox.   You are exposed to German measles (rubella) and have never had it.  Get help right away if:   You have a fever.   You are leaking fluid from your vagina.   You have spotting or bleeding from your vagina.   You have severe abdominal cramping or pain.   You have rapid weight gain or loss.   You vomit blood or material that looks like coffee grounds.   You develop a severe headache.   You have shortness of breath.   You have any kind of trauma, such as from a fall or a car accident.  Summary   The first trimester of pregnancy is from week 1 until   the end of week 13 (months 1 through 3).   Your body goes through many changes during pregnancy. The changes vary from  woman to woman.   You will have routine prenatal visits. During those visits, your health care provider will examine you, discuss any test results you may have, and talk with you about how you are feeling.  This information is not intended to replace advice given to you by your health care provider. Make sure you discuss any questions you have with your health care provider.  Document Released: 03/05/2001 Document Revised: 02/21/2016 Document Reviewed: 02/21/2016  Elsevier Interactive Patient Education  2019 [employer]vier Inc[employer]

## 2018-05-25 NOTE — Progress Notes (Signed)
Patient ID: Maria Serrano, female   DOB: 1996/01/16, 23 y.o.   MRN: [id number] History of Present Illness: Zinachimdi is a 23 year old white female, separated, in for UPT, had IUD removed 04/14/2018 and no priods since and had 2+HPTs. Has 53 th month old little boy.    Current Medications, Allergies, Past Medical History, Past Surgical History, Family History and Social History were reviewed in [employer] record.     Review of Systems: No period since IUD removed 04/14/2018, had 2+HPTs    Physical Exam:BP 120/79 (BP Location: Right Arm, Patient Position: Sitting, Cuff Size: Normal)   Pulse 75   Ht 5' (1.524 m)   Wt 170 lb (77.1 kg)   LMP 03/17/2018   Breastfeeding No   BMI 33.20 kg/m   UPT, +about 4 weeks by my quess, with EDD 02/02/2019, IUD was removed 04/14/2018. General:  Well developed, well nourished, no acute distress Skin:  Warm and dry Neck:  Midline trachea, normal thyroid, good ROM, no lymphadenopathy Lungs; Clear to auscultation bilaterally Cardiovascular: Regular rate and rhythm Abdomen:  Soft, non tender Psych:  No mood changes, alert and cooperative,seems happy Fall risk is low. To apply for pregnancy medicaid.   Impression: 1. Positive pregnancy test   2. Pregnancy, unspecified gestational age   63. Encounter to determine fetal viability of pregnancy, single or unspecified fetus   4. Short interval between pregnancies affecting pregnancy in first trimester, antepartum       Plan: Will check QHCG and progesterone  Return in 3 weeks for dating [place] and 6 weeks new OB Continue PNV Review handout on First trimester and by [facility]

## 2018-05-26 LAB — PROGESTERONE: Progesterone: 14.2 ng/mL

## 2018-05-26 LAB — BETA HCG QUANT (REF LAB): hCG Quant: 35138 m[IU]/mL

## 2018-06-15 ENCOUNTER — Other Ambulatory Visit: Payer: Medicaid Other

## 2018-06-16 ENCOUNTER — Other Ambulatory Visit: Payer: Medicaid Other

## 2018-06-24 HISTORY — PX: DILATION AND CURETTAGE OF UTERUS: SHX78

## 2018-08-02 IMAGING — CT CT ANGIO CHEST
2 of 6 series · 19 of 46 positions shown · IV contrast (Isovue)
Comparison: None.

CLINICAL DATA: Acute onset shortness of breath this morning. High
pretest probability for pulmonary embolism.

EXAM:
CT ANGIOGRAPHY CHEST WITH CONTRAST
TECHNIQUE: Multidetector CT imaging of the chest was performed using the
standard protocol during bolus administration of intravenous
contrast. Multiplanar CT image reconstructions and MIPs were
obtained to evaluate the vascular anatomy.
CONTRAST:  75mL 7KBI9I-3PN IOPAMIDOL (7KBI9I-3PN) INJECTION 76%

[Series 5: thins · axial · 0.64mm/px · z∈[+1273,+1525]mm · 16 of 276 slices shown]
[im 12/276  lung]
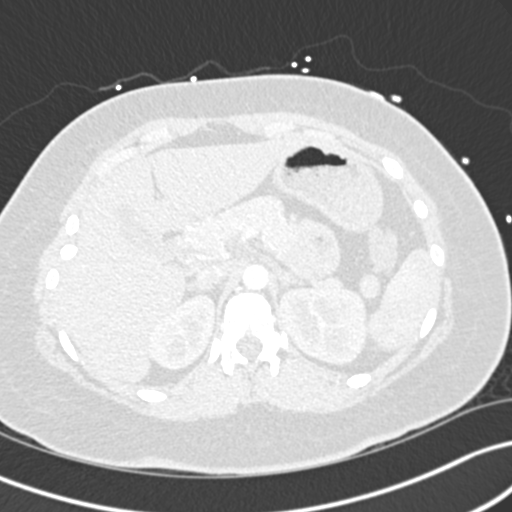
[im 36/276  soft-tissue]
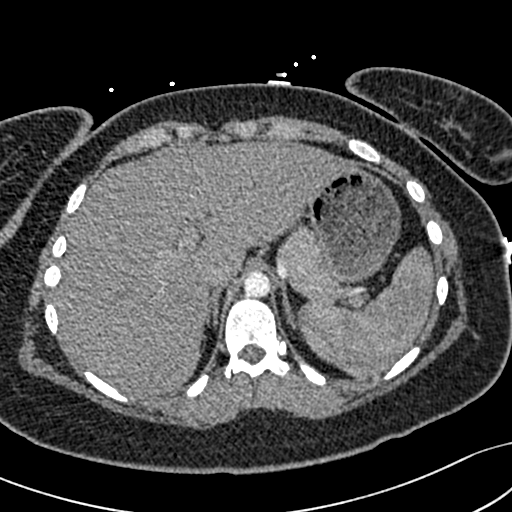
[im 48/276  lung]
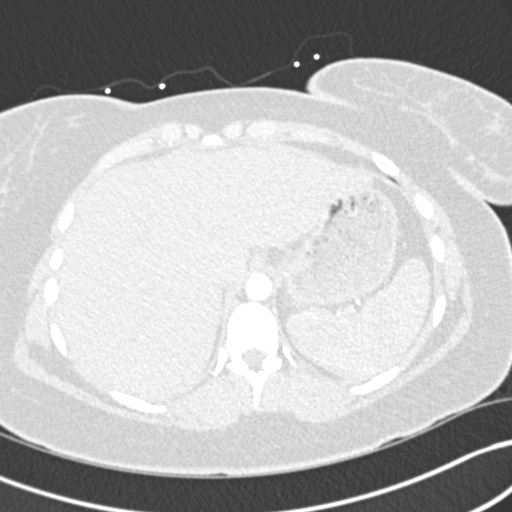
[im 60/276  soft-tissue]
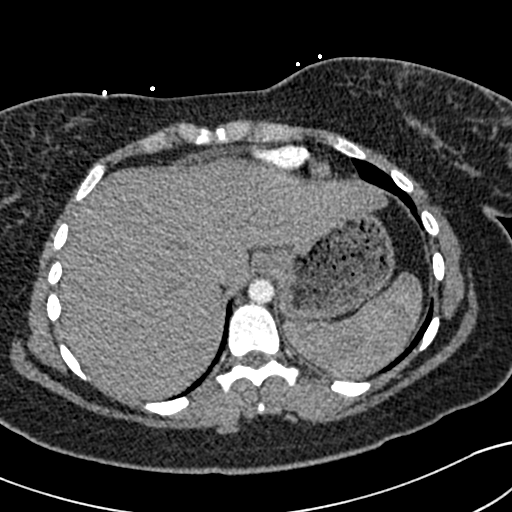
[im 84/276  lung]
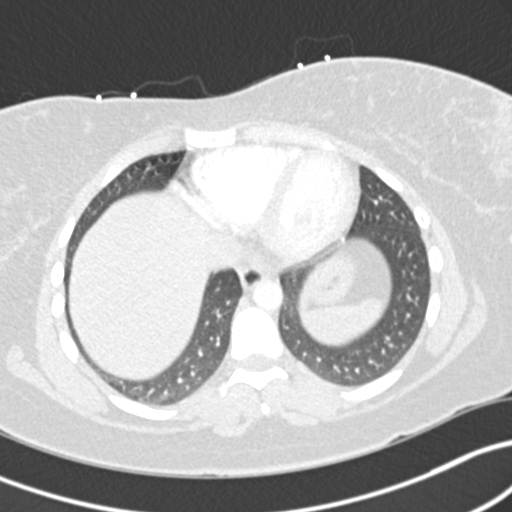
[im 96/276  soft-tissue]
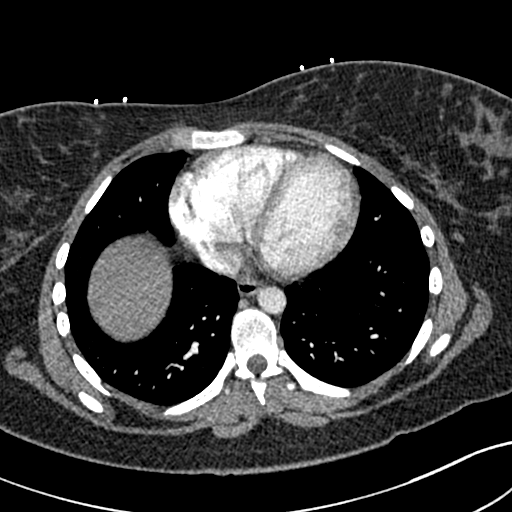
[im 108/276  lung]
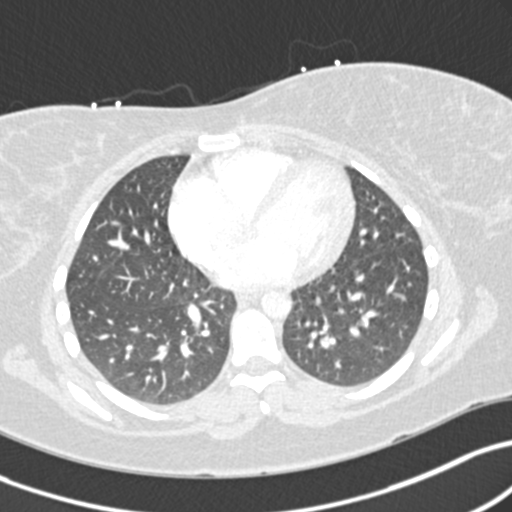
[im 132/276  soft-tissue]
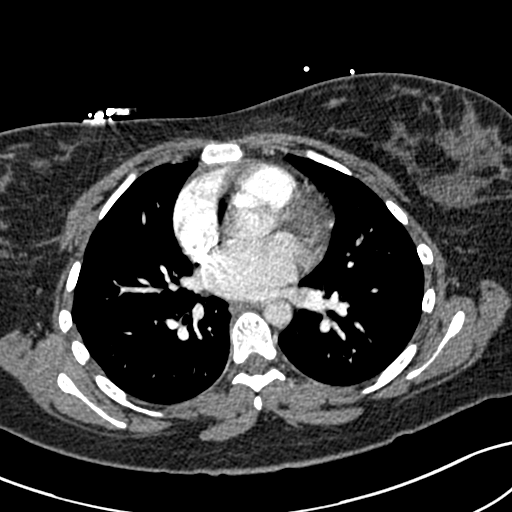
[im 144/276  lung]
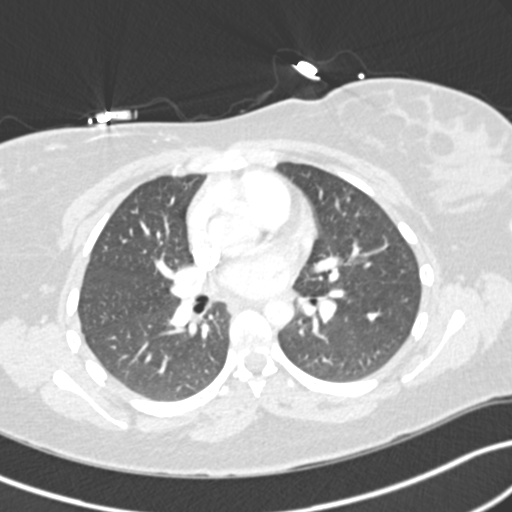
[im 168/276  soft-tissue]
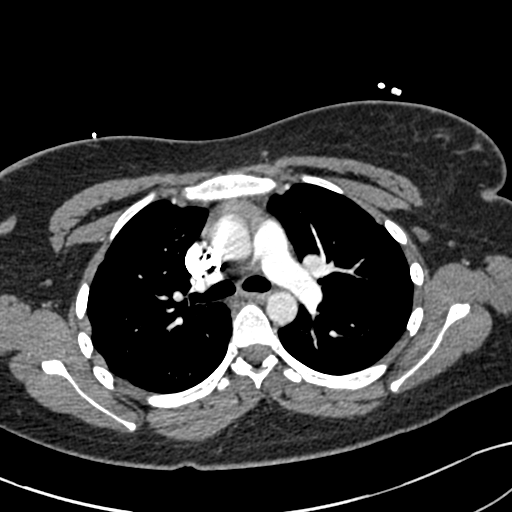
[im 180/276  lung]
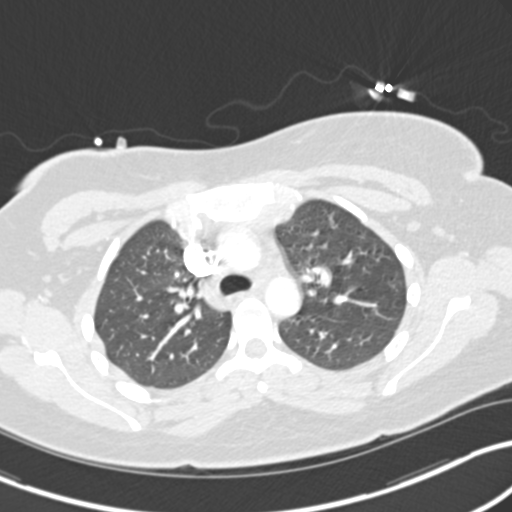
[im 192/276  soft-tissue]
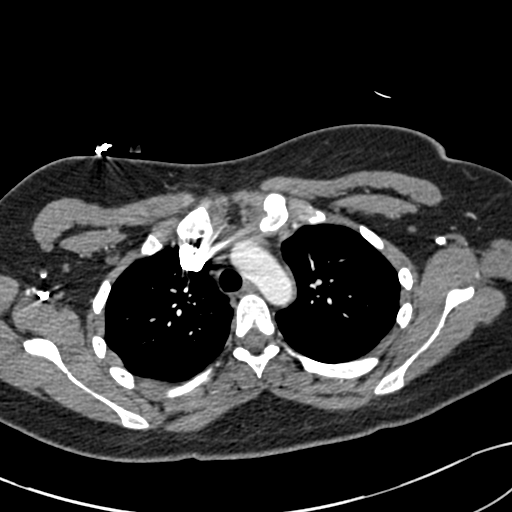
[im 216/276  lung]
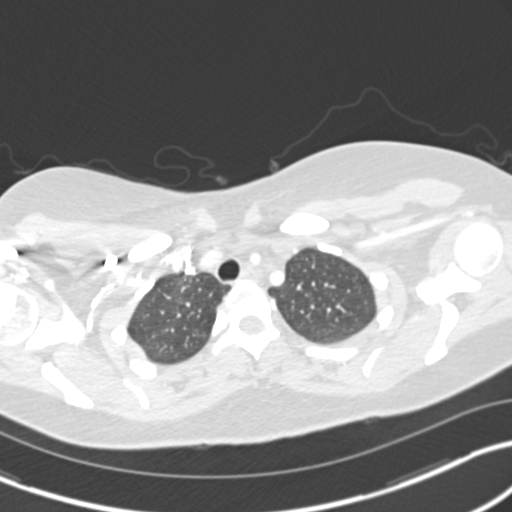
[im 228/276  soft-tissue]
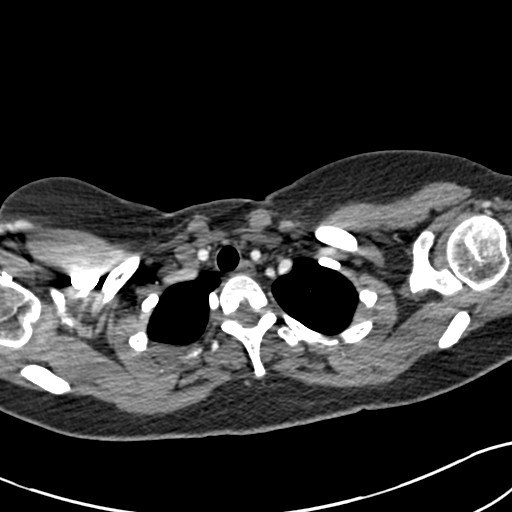
[im 240/276  lung]
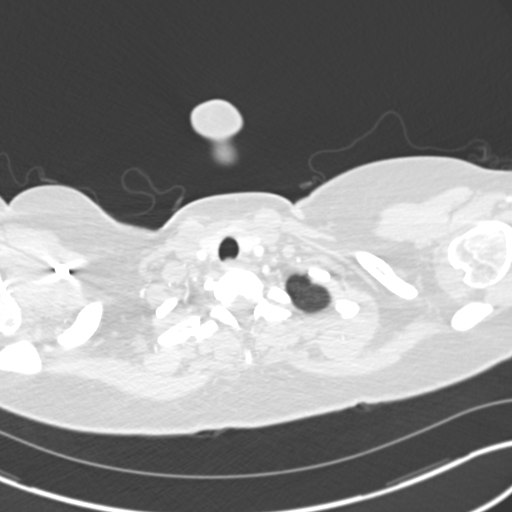
[im 264/276  soft-tissue]
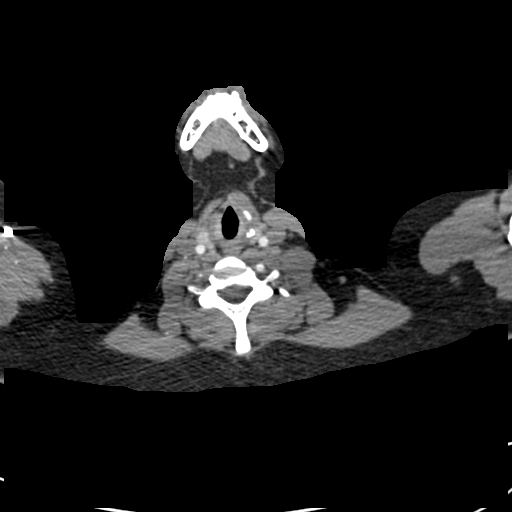

[Series 7: coronal mpr · coronal · 0.58mm/px · 3 of 116 slices shown]
[im 29/116  soft-tissue]
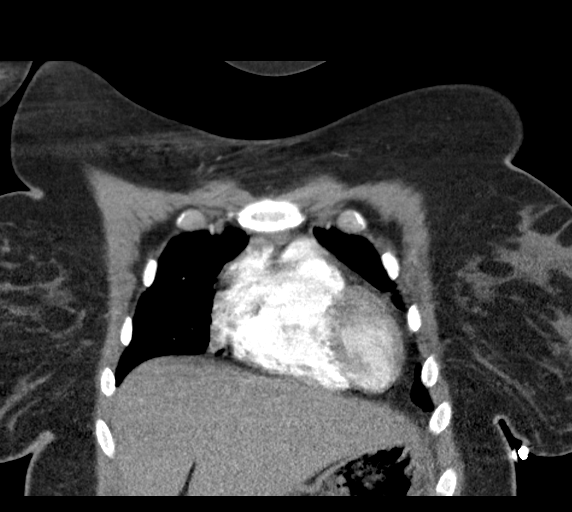
[im 58/116  soft-tissue]
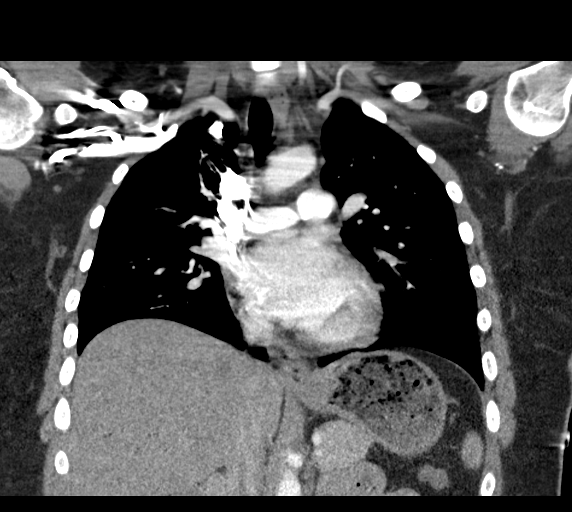
[im 87/116  soft-tissue]
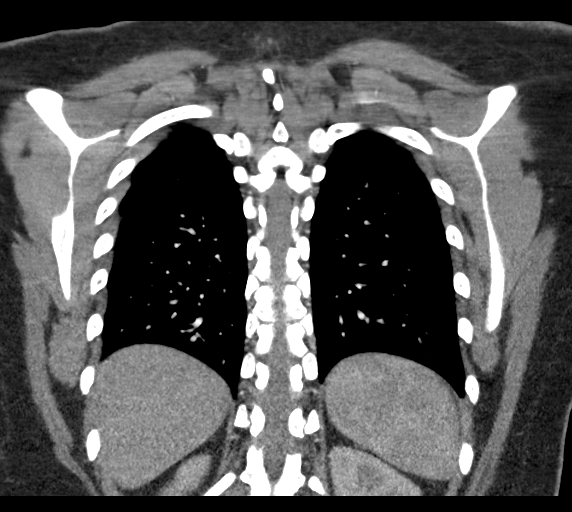

[19 of 46 positions shown; findings below may reference images not displayed]

FINDINGS: Cardiovascular: Satisfactory opacification of pulmonary arteries
noted, and no pulmonary emboli identified. No evidence of thoracic
aortic dissection or aneurysm.

Mediastinum/Nodes: No masses or pathologically enlarged lymph nodes
identified.

Lungs/Pleura: No pulmonary mass, infiltrate, or effusion.

Upper abdomen: No acute findings.

Musculoskeletal: No suspicious bone lesions identified.

Review of the MIP images confirms the above findings.
IMPRESSION: Negative. No evidence of pulmonary embolism or other active disease.

## 2018-08-02 IMAGING — DX DG CHEST 2V
2 series · 2 of 2 positions shown · non-contrast
Comparison: None.

CLINICAL DATA: Shortness of breath

EXAM:
CHEST  2 VIEW

[chest pa]
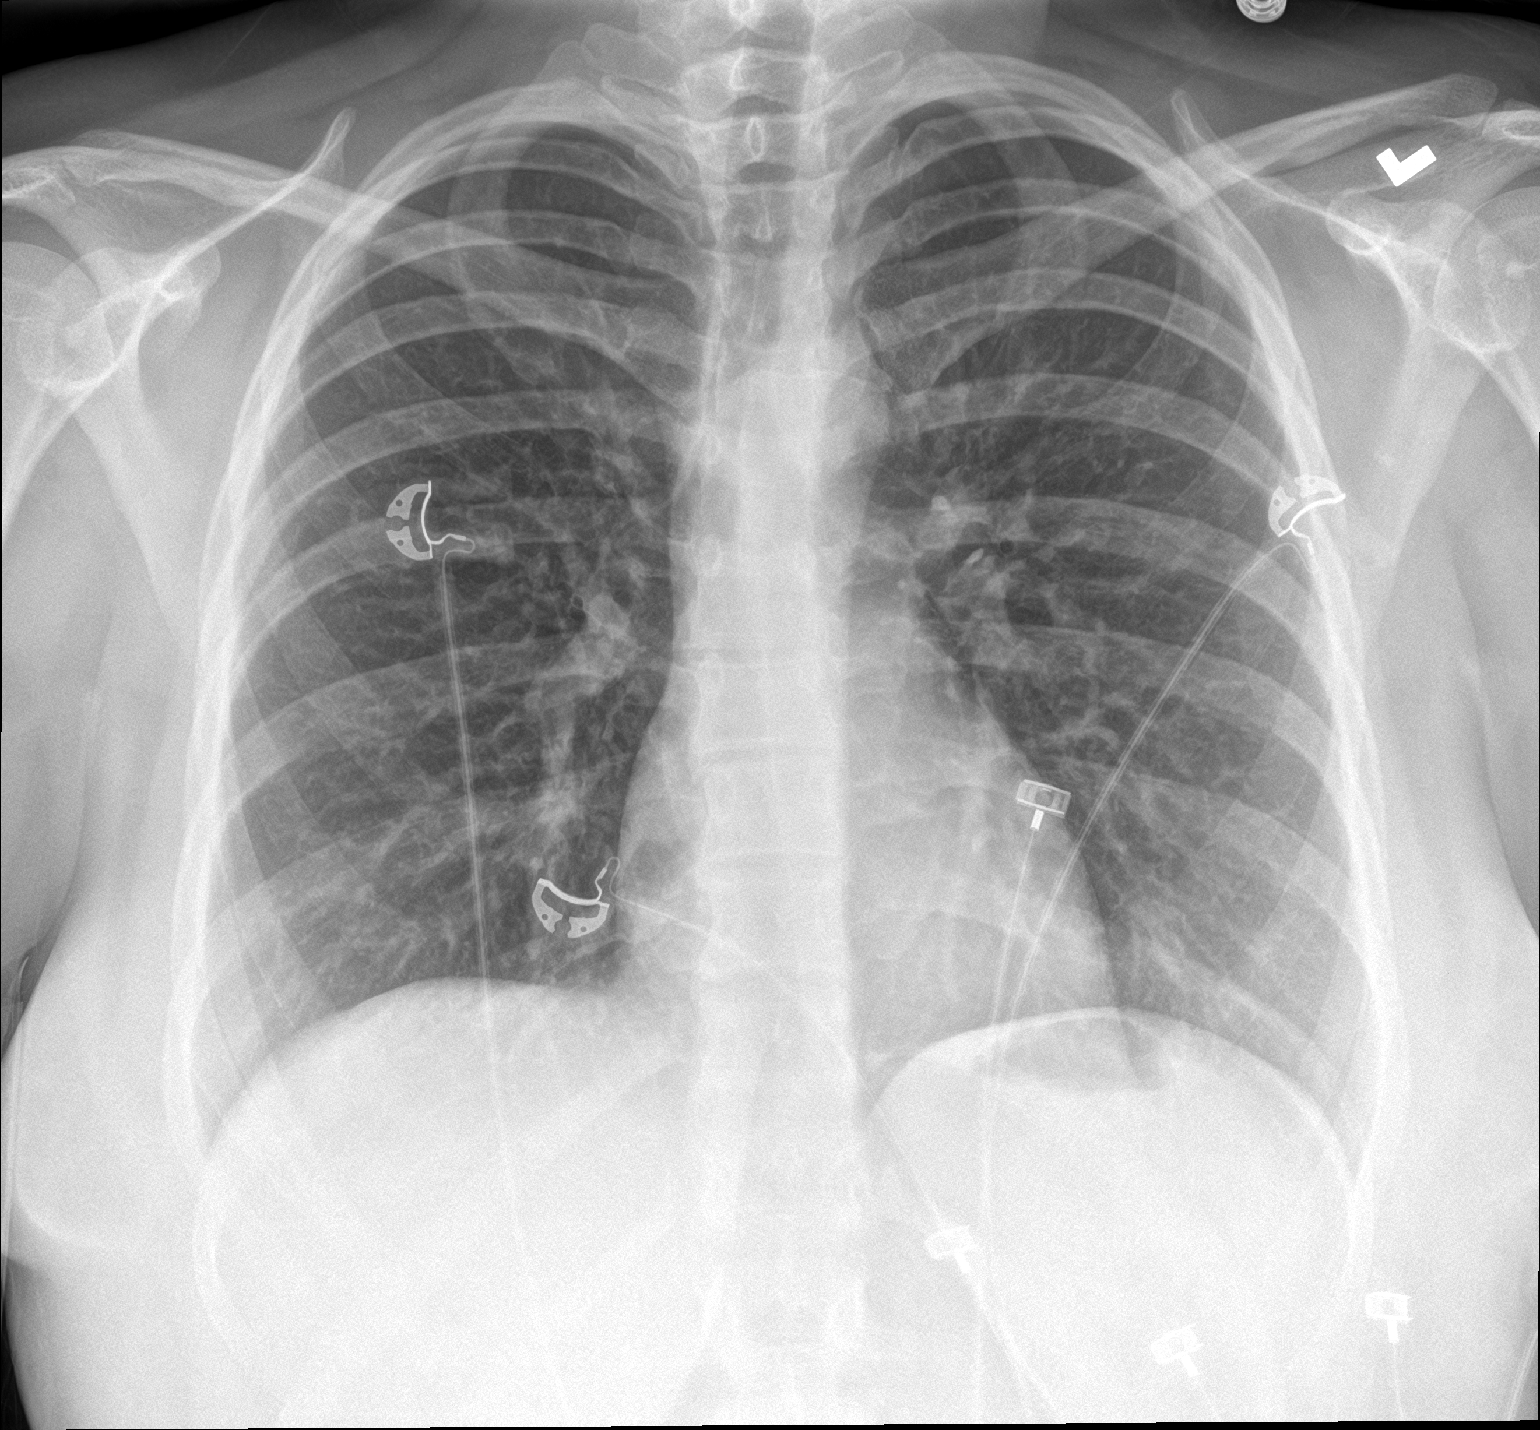

[chest lat]
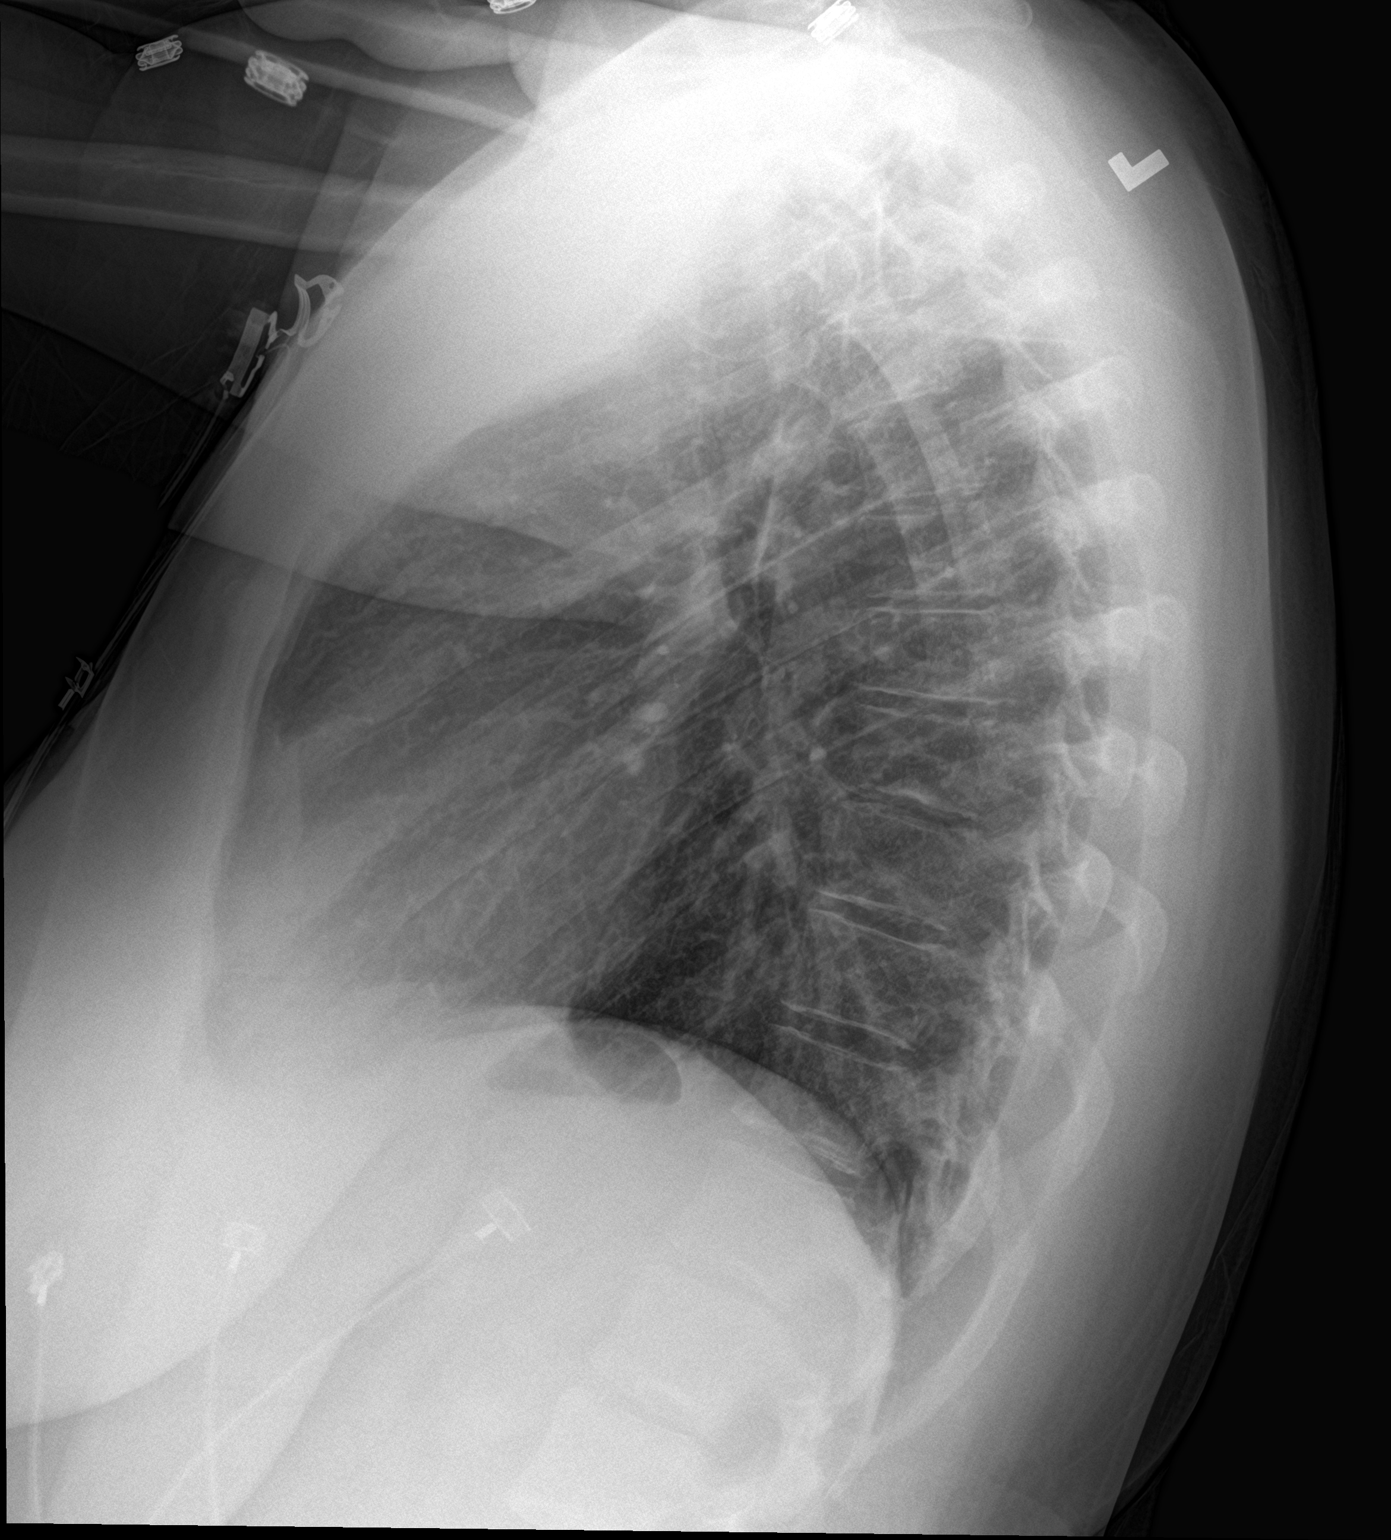

[2 of 2 positions shown; findings below may reference images not displayed]

FINDINGS: The lungs are clear without focal pneumonia, edema, pneumothorax or
pleural effusion. The cardiopericardial silhouette is within normal
limits for size. The visualized bony structures of the thorax are
intact.
IMPRESSION: No active cardiopulmonary disease.

## 2019-01-09 DIAGNOSIS — Z23 Encounter for immunization: Secondary | ICD-10-CM | POA: Diagnosis not present

## 2019-06-16 ENCOUNTER — Ambulatory Visit: Payer: Medicaid Other | Admitting: Obstetrics and Gynecology

## 2019-10-26 ENCOUNTER — Ambulatory Visit: Payer: Medicaid Other | Admitting: Women's Health

## 2019-11-01 ENCOUNTER — Encounter: Payer: Self-pay | Admitting: Adult Health

## 2019-11-01 ENCOUNTER — Ambulatory Visit (INDEPENDENT_AMBULATORY_CARE_PROVIDER_SITE_OTHER): Payer: Medicaid Other | Admitting: Adult Health

## 2019-11-01 VITALS — BP 116/84 | HR 82 | Ht 63.0 in | Wt 212.0 lb

## 2019-11-01 DIAGNOSIS — Z3009 Encounter for other general counseling and advice on contraception: Secondary | ICD-10-CM | POA: Diagnosis not present

## 2019-11-01 DIAGNOSIS — Z3202 Encounter for pregnancy test, result negative: Secondary | ICD-10-CM | POA: Insufficient documentation

## 2019-11-01 LAB — POCT URINE PREGNANCY: Preg Test, Ur: NEGATIVE

## 2019-11-01 NOTE — Progress Notes (Signed)
  Subjective:     Patient ID: Maria Serrano, female   DOB: 01/07/1996, 24 y.o.   MRN: [id number]  HPI Maria Serrano is a 24 year old white female, married, G3P0111, in to discuss birth control, wants one does not have to remember to take.Thinking about IUD Had sex last week with condom.   Review of Systems Periods not regular since stopping Nuva ring Pt wants Birth control she does not have to remember like IUD Reviewed past medical,surgical, social and family history. Reviewed medications and allergies.     Objective:   Physical Exam BP 116/84 (BP Location: Left Arm, Patient Position: Sitting, Cuff Size: Large)   Pulse 82   Ht 5\' 3"  (1.6 m)   Wt 212 lb (96.2 kg)   LMP 10/15/2019   Breastfeeding No   BMI 37.55 kg/m UPT is negative Skin warm and dry. Lungs: clear to ausculation bilaterally. Cardiovascular: regular rate and rhythm. Fall risk is low]  Upstream - 11/01/19 1112      Pregnancy Intention Screening   Does the patient want to become pregnant in the next year? No    Does the patient's partner want to become pregnant in the next year? No    Would the patient like to discuss contraceptive options today? Yes      Contraception Wrap Up   Current Method Female Condom    End Method IUD or IUS    Contraception Counseling Provided Yes             Assessment:     1. Pregnancy examination or test, negative result  2. General counseling and advice for contraceptive management Call me 8/23 in am to let me know if on period, if not, and has negative HPT, will rx Cytotec 400 MCG for that am and come in at 3:10 pm to see 9/23 for IUD insertion She has handout on liletta  No sex til after IUD inserted      Plan:     For IUD insertion 8/23

## 2019-11-15 ENCOUNTER — Ambulatory Visit (INDEPENDENT_AMBULATORY_CARE_PROVIDER_SITE_OTHER): Payer: Medicaid Other | Admitting: Women's Health

## 2019-11-15 ENCOUNTER — Telehealth: Payer: Self-pay | Admitting: Adult Health

## 2019-11-15 ENCOUNTER — Encounter: Payer: Self-pay | Admitting: Women's Health

## 2019-11-15 VITALS — BP 127/85 | HR 108 | Ht 63.0 in | Wt 212.5 lb

## 2019-11-15 DIAGNOSIS — Z30430 Encounter for insertion of intrauterine contraceptive device: Secondary | ICD-10-CM

## 2019-11-15 DIAGNOSIS — Z3202 Encounter for pregnancy test, result negative: Secondary | ICD-10-CM

## 2019-11-15 LAB — POCT URINE PREGNANCY: Preg Test, Ur: NEGATIVE

## 2019-11-15 MED ORDER — LEVONORGESTREL 19.5 MCG/DAY IU IUD: INTRAUTERINE_SYSTEM | Freq: Once | INTRAUTERINE | Status: AC

## 2019-11-15 MED ORDER — MISOPROSTOL 200 MCG PO TABS: ORAL_TABLET | ORAL | 0 refills | Status: DC

## 2019-11-15 NOTE — Progress Notes (Signed)
   IUD INSERTION Patient name: Maria Serrano MRN [id number]  Date of birth: [birth year] Subjective Findings:   Maria Serrano is a 24 y.o. [phone] Caucasian female being seen today for insertion of a Liletta IUD.  Depression screen [facility] 2/9 03/13/2017 01/29/2017 08/22/2016  Decreased Interest 0 0 0  Down, Depressed, Hopeless 1 0 0  PHQ - 2 Score 1 0 0  Altered sleeping 0 - 0  Tired, decreased energy 1 - 2  Change in appetite 0 - 2  Feeling bad or failure about yourself  0 - 0  Trouble concentrating 0 - 0  Moving slowly or fidgety/restless 0 - 0  Suicidal thoughts 0 - 0  PHQ-9 Score 2 - 4  Difficult doing work/chores Not difficult at all - -    Patient's last menstrual period was 10/20/2019 (approximate). Last sexual intercourse was ago Last pap5/31/18. Results were:  normal  The risks and benefits of the method and placement have been thouroughly reviewed with the patient and all questions were answered.  Specifically the patient is aware of failure rate of 03/998, expulsion of the IUD and of possible perforation.  The patient is aware of irregular bleeding due to the method and understands the incidence of irregular bleeding diminishes with time.  Signed copy of informed consent in chart.  Pertinent History Reviewed:   Reviewed past medical,surgical, social, obstetrical and family history.  Reviewed problem list, medications and allergies. Objective Findings & Procedure:   Vitals:   11/15/19 1509  BP: 127/85  Pulse: (!) 108  Weight: 212 lb 8 oz (96.4 kg)  Height: 5\' 3"  (1.6 m)  Body mass index is 37.64 kg/m.  Results for orders placed or performed in visit on 11/15/19 (from the past 24 hour(s))  POCT urine pregnancy   Collection Time: 11/15/19  3:12 PM  Result Value Ref Range   Preg Test, Ur Negative Negative     Time out was performed.  A graves speculum was placed in the vagina.  The cervix was visualized, prepped using Betadine, and grasped with a single  tooth tenaculum. The uterus was found to be anteroflexed and it sounded to 8 cm.  Liletta  IUD placed per manufacturer's recommendations. The strings were trimmed to approximately 3 cm. The patient tolerated the procedure well.   Informal transvaginal sonogram was performed and the proper placement of the IUD was verified.  Chaperone: 11/17/19   Assessment & Plan:   1) Liletta IUD insertion The patient was given post procedure instructions, including signs and symptoms of infection and to check for the strings after each menses or each month, and refraining from intercourse or anything in the vagina for 3 days. She was given a care card with date IUD placed, and date IUD to be removed. She is scheduled for a f/u appointment in 4 weeks.   Orders Placed This Encounter  Procedures  . POCT urine pregnancy    Return in about 4 weeks (around 12/13/2019) for Pap & physical and IUD f/u w/ CNM.  12/15/2019 CNM, [facility] 11/15/2019 3:50 PM

## 2019-11-15 NOTE — Telephone Encounter (Signed)
Patient called stating that she was told by Victorino Dike if she did not start her period today to call the office so she could call in a medication to dailate her cervix so she could have her IUD placed today at 3:30 pm with Selena Batten. Pt uses [facility] on [address]. Please contact pt

## 2019-11-15 NOTE — Telephone Encounter (Signed)
Sent rx for cytotec, her mailbox was full

## 2019-11-15 NOTE — Telephone Encounter (Signed)
Patient called stating that she was told by Victorino Dike if she did not start her period today to call the office so she could call in a medication to dailate her cervix so she could have her IUD placed today at 3:30 pm with Selena Batten. Pt uses [facility] on [address]. Please contact ptan

## 2019-11-15 NOTE — Addendum Note (Signed)
Addended by: Colen Darling on: 11/15/2019 04:15 PM   Modules accepted: Orders

## 2019-11-15 NOTE — Patient Instructions (Signed)
 Nothing in vagina for 3 days (no sex, douching, tampons, etc...)  Check your strings once a month to make sure you can feel them, if you are not able to please let us know  If you develop a fever of 100.4 or more in the next few weeks, or if you develop severe abdominal pain, please let us know  Use a backup method of birth control, such as condoms, for 2 weeks     Intrauterine Device Insertion, Care After  This sheet gives you information about how to care for yourself after your procedure. Your health care provider may also give you more specific instructions. If you have problems or questions, contact your health care provider. What can I expect after the procedure? After the procedure, it is common to have:  Cramps and pain in the abdomen.  Light bleeding (spotting) or heavier bleeding that is like your menstrual period. This may last for up to a few days.  Lower back pain.  Dizziness.  Headaches.  Nausea. Follow these instructions at home:  Before resuming sexual activity, check to make sure that you can feel the IUD string(s). You should be able to feel the end of the string(s) below the opening of your cervix. If your IUD string is in place, you may resume sexual activity. ? If you had a hormonal IUD inserted more than 7 days after your most recent period started, you will need to use a backup method of birth control for 7 days after IUD insertion. Ask your health care provider whether this applies to you.  Continue to check that the IUD is still in place by feeling for the string(s) after every menstrual period, or once a month.  Take over-the-counter and prescription medicines only as told by your health care provider.  Do not drive or use heavy machinery while taking prescription pain medicine.  Keep all follow-up visits as told by your health care provider. This is important. Contact a health care provider if:  You have bleeding that is heavier or lasts longer  than a normal menstrual cycle.  You have a fever.  You have cramps or abdominal pain that get worse or do not get better with medicine.  You develop abdominal pain that is new or is not in the same area of earlier cramping and pain.  You feel lightheaded or weak.  You have abnormal or bad-smelling discharge from your vagina.  You have pain during sexual activity.  You have any of the following problems with your IUD string(s): ? The string bothers or hurts you or your sexual partner. ? You cannot feel the string. ? The string has gotten longer.  You can feel the IUD in your vagina.  You think you may be pregnant, or you miss your menstrual period.  You think you may have an STI (sexually transmitted infection). Get help right away if:  You have flu-like symptoms.  You have a fever and chills.  You can feel that your IUD has slipped out of place. Summary  After the procedure, it is common to have cramps and pain in the abdomen. It is also common to have light bleeding (spotting) or heavier bleeding that is like your menstrual period.  Continue to check that the IUD is still in place by feeling for the string(s) after every menstrual period, or once a month.  Keep all follow-up visits as told by your health care provider. This is important.  Contact your health care provider   if you have problems with your IUD string(s), such as the string getting longer or bothering you or your sexual partner. This information is not intended to replace advice given to you by your health care provider. Make sure you discuss any questions you have with your health care provider. Document Revised: 02/21/2017 Document Reviewed: 01/31/2016 Elsevier Patient Education  2020 Elsevier Inc.  

## 2019-12-13 ENCOUNTER — Encounter: Payer: Self-pay | Admitting: Women's Health

## 2019-12-13 ENCOUNTER — Other Ambulatory Visit (HOSPITAL_COMMUNITY)
Admission: RE | Admit: 2019-12-13 | Discharge: 2019-12-13 | Disposition: A | Discharge status: Home / Self Care | Payer: Medicaid Other | Source: Ambulatory Visit | Attending: Obstetrics & Gynecology | Admitting: Obstetrics & Gynecology

## 2019-12-13 ENCOUNTER — Ambulatory Visit (INDEPENDENT_AMBULATORY_CARE_PROVIDER_SITE_OTHER): Payer: Medicaid Other | Admitting: Women's Health

## 2019-12-13 VITALS — BP 130/87 | HR 96 | Ht 63.0 in | Wt 217.0 lb

## 2019-12-13 DIAGNOSIS — Z0000 Encounter for general adult medical examination without abnormal findings: Secondary | ICD-10-CM

## 2019-12-13 DIAGNOSIS — Z30431 Encounter for routine checking of intrauterine contraceptive device: Secondary | ICD-10-CM

## 2019-12-13 MED ORDER — MEGESTROL ACETATE 40 MG PO TABS: ORAL_TABLET | ORAL | 1 refills | Status: DC

## 2019-12-13 NOTE — Progress Notes (Signed)
WELL-WOMAN EXAMINATION Patient name: Maria Serrano MRN [id number]  Date of birth: 17-Apr-1995 Chief Complaint:   Annual Exam  History of Present Illness:   Maria Serrano is a 24 y.o. [phone] Caucasian female being seen today for a routine well-woman exam and IUD f/u. Liletta inserted 11/15/19.  Current complaints: daily bleeding since IUD insertion, would like meds to help stop bleeding  Depression screen [facility] 2/9 03/13/2017 01/29/2017 08/22/2016  Decreased Interest 0 0 0  Down, Depressed, Hopeless 1 0 0  PHQ - 2 Score 1 0 0  Altered sleeping 0 - 0  Tired, decreased energy 1 - 2  Change in appetite 0 - 2  Feeling bad or failure about yourself  0 - 0  Trouble concentrating 0 - 0  Moving slowly or fidgety/restless 0 - 0  Suicidal thoughts 0 - 0  PHQ-9 Score 2 - 4  Difficult doing work/chores Not difficult at all - -     PCP: none      does not desire labs No LMP recorded. (Menstrual status: IUD). The current method of family planning is IUD, Liletta inserted 11/15/19  Last pap 08/22/16. Results were: normal. H/O abnormal pap: no Last mammogram: never. Results were: N/A. Family h/o breast cancer: no Last colonoscopy: never. Results were: N/A. Family h/o colorectal cancer: no Review of Systems:   Pertinent items are noted in HPI Denies any headaches, blurred vision, fatigue, shortness of breath, chest pain, abdominal pain, abnormal vaginal discharge/itching/odor/irritation, problems with periods, bowel movements, urination, or intercourse unless otherwise stated above. Pertinent History Reviewed:  Reviewed past medical,surgical, social and family history.  Reviewed problem list, medications and allergies. Physical Assessment:   Vitals:   12/13/19 1403  BP: 130/87  Pulse: 96  Weight: 217 lb (98.4 kg)  Height: 5\' 3"  (1.6 m)  Body mass index is 38.44 kg/m.        Physical Examination:   General appearance - well appearing, and in no distress  Mental status - alert,  oriented to person, place, and time  Psych:  She has a normal mood and affect  Skin - warm and dry, normal color, no suspicious lesions noted  Chest - effort normal, all lung fields clear to auscultation bilaterally  Heart - normal rate and regular rhythm  Neck:  midline trachea, no thyromegaly or nodules  Breasts - breasts appear normal, no suspicious masses, no skin or nipple changes or  axillary nodes  Abdomen - soft, nontender, nondistended, no masses or organomegaly  Pelvic - VULVA: normal appearing vulva with no masses, tenderness or lesions  VAGINA: normal appearing vagina with normal color and discharge, no lesions, normal menstrual blood  CERVIX: normal appearing cervix without discharge or lesions, no CMT, IUD strings visible, appropriate length  Thin prep pap is done w/ HR HPV cotesting  UTERUS: uterus is felt to be normal size, shape, consistency and nontender   ADNEXA: No adnexal masses or tenderness noted.  Extremities:  No swelling or varicosities noted  Chaperone: Latisha Cresenzo    No results found for this or any previous visit (from the past 24 hour(s)).  Assessment & Plan:  1) Well-Woman Exam  2) IUD f/u> in place, daily bleeding since insertion, rx megace  Labs/procedures today: pap  Mammogram @26yo  or sooner if problems Colonoscopy @26yo  or sooner if problems  No orders of the defined types were placed in this encounter.   Meds:  Meds ordered this encounter  Medications  . megestrol (MEGACE) 40 MG  tablet    Sig: 3x5d, 2x5d, then 1 daily to help control vaginal bleeding. Stop taking when bleeding stops.    Dispense:  45 tablet    Refill:  1    Order Specific Question:   Supervising Provider    Answer:   Lazaro Arms [[id number]]    Follow-up: Return in about 1 year (around 12/12/2020) for Physical.  Cheral Marker CNM, WHNP-BC 12/13/2019 2:19 PM

## 2019-12-16 LAB — CYTOLOGY - PAP
Chlamydia: NEGATIVE
Comment: NEGATIVE
Comment: NEGATIVE
Comment: NORMAL
Diagnosis: NEGATIVE
High risk HPV: NEGATIVE
Neisseria Gonorrhea: NEGATIVE

## 2021-01-15 ENCOUNTER — Ambulatory Visit: Payer: Medicaid Other | Admitting: Family Medicine

## 2021-03-02 ENCOUNTER — Ambulatory Visit: Payer: Medicaid Other | Admitting: Women's Health

## 2021-03-14 DIAGNOSIS — R4589 Other symptoms and signs involving emotional state: Secondary | ICD-10-CM | POA: Diagnosis not present

## 2021-03-14 DIAGNOSIS — J3489 Other specified disorders of nose and nasal sinuses: Secondary | ICD-10-CM | POA: Diagnosis not present

## 2021-03-14 DIAGNOSIS — R519 Headache, unspecified: Secondary | ICD-10-CM | POA: Diagnosis not present

## 2021-03-14 DIAGNOSIS — H9201 Otalgia, right ear: Secondary | ICD-10-CM | POA: Diagnosis not present

## 2021-03-14 DIAGNOSIS — R058 Other specified cough: Secondary | ICD-10-CM | POA: Diagnosis not present

## 2021-03-14 DIAGNOSIS — Z79899 Other long term (current) drug therapy: Secondary | ICD-10-CM | POA: Diagnosis not present

## 2021-03-14 DIAGNOSIS — H6501 Acute serous otitis media, right ear: Secondary | ICD-10-CM | POA: Diagnosis not present

## 2021-03-21 DIAGNOSIS — R4589 Other symptoms and signs involving emotional state: Secondary | ICD-10-CM | POA: Diagnosis not present

## 2021-04-05 DIAGNOSIS — R4589 Other symptoms and signs involving emotional state: Secondary | ICD-10-CM | POA: Diagnosis not present

## 2021-04-10 ENCOUNTER — Ambulatory Visit: Payer: Medicaid Other | Admitting: Family Medicine

## 2021-04-18 DIAGNOSIS — R4589 Other symptoms and signs involving emotional state: Secondary | ICD-10-CM | POA: Diagnosis not present

## 2021-09-27 DIAGNOSIS — G43809 Other migraine, not intractable, without status migrainosus: Secondary | ICD-10-CM | POA: Diagnosis not present

## 2021-09-27 DIAGNOSIS — R002 Palpitations: Secondary | ICD-10-CM | POA: Diagnosis not present

## 2021-11-13 DIAGNOSIS — E669 Obesity, unspecified: Secondary | ICD-10-CM | POA: Diagnosis not present

## 2021-11-13 DIAGNOSIS — E785 Hyperlipidemia, unspecified: Secondary | ICD-10-CM | POA: Diagnosis not present

## 2021-11-13 DIAGNOSIS — Z6839 Body mass index (BMI) 39.0-39.9, adult: Secondary | ICD-10-CM | POA: Diagnosis not present

## 2021-11-14 DIAGNOSIS — R519 Headache, unspecified: Secondary | ICD-10-CM | POA: Diagnosis not present

## 2021-11-14 DIAGNOSIS — J22 Unspecified acute lower respiratory infection: Secondary | ICD-10-CM | POA: Diagnosis not present

## 2021-11-14 DIAGNOSIS — J029 Acute pharyngitis, unspecified: Secondary | ICD-10-CM | POA: Diagnosis not present

## 2021-11-14 DIAGNOSIS — R052 Subacute cough: Secondary | ICD-10-CM | POA: Diagnosis not present

## 2021-12-11 DIAGNOSIS — G43909 Migraine, unspecified, not intractable, without status migrainosus: Secondary | ICD-10-CM | POA: Diagnosis not present

## 2021-12-11 DIAGNOSIS — T887XXA Unspecified adverse effect of drug or medicament, initial encounter: Secondary | ICD-10-CM | POA: Diagnosis not present

## 2021-12-25 DIAGNOSIS — Z6841 Body Mass Index (BMI) 40.0 and over, adult: Secondary | ICD-10-CM | POA: Diagnosis not present

## 2021-12-25 DIAGNOSIS — E785 Hyperlipidemia, unspecified: Secondary | ICD-10-CM | POA: Diagnosis not present

## 2022-01-29 DIAGNOSIS — Z124 Encounter for screening for malignant neoplasm of cervix: Secondary | ICD-10-CM | POA: Diagnosis not present

## 2022-01-29 DIAGNOSIS — R102 Pelvic and perineal pain: Secondary | ICD-10-CM | POA: Diagnosis not present

## 2022-02-07 DIAGNOSIS — G43909 Migraine, unspecified, not intractable, without status migrainosus: Secondary | ICD-10-CM | POA: Diagnosis not present

## 2022-05-17 DIAGNOSIS — F419 Anxiety disorder, unspecified: Secondary | ICD-10-CM | POA: Diagnosis not present

## 2022-05-23 DIAGNOSIS — F419 Anxiety disorder, unspecified: Secondary | ICD-10-CM | POA: Diagnosis not present

## 2022-05-23 DIAGNOSIS — F32A Depression, unspecified: Secondary | ICD-10-CM | POA: Diagnosis not present

## 2022-05-31 DIAGNOSIS — F321 Major depressive disorder, single episode, moderate: Secondary | ICD-10-CM | POA: Diagnosis not present

## 2022-05-31 DIAGNOSIS — F419 Anxiety disorder, unspecified: Secondary | ICD-10-CM | POA: Diagnosis not present

## 2022-06-03 DIAGNOSIS — F419 Anxiety disorder, unspecified: Secondary | ICD-10-CM | POA: Diagnosis not present

## 2022-06-07 DIAGNOSIS — F419 Anxiety disorder, unspecified: Secondary | ICD-10-CM | POA: Diagnosis not present

## 2022-06-07 DIAGNOSIS — F32A Depression, unspecified: Secondary | ICD-10-CM | POA: Diagnosis not present

## 2022-06-14 ENCOUNTER — Encounter: Payer: Self-pay | Admitting: Allergy

## 2022-06-14 ENCOUNTER — Ambulatory Visit: Payer: Medicaid Other | Admitting: Allergy

## 2022-06-14 ENCOUNTER — Other Ambulatory Visit: Payer: Self-pay

## 2022-06-14 VITALS — BP 110/72 | HR 69 | Temp 98.2°F | Resp 18 | Ht 65.0 in | Wt 232.9 lb

## 2022-06-14 DIAGNOSIS — J45990 Exercise induced bronchospasm: Secondary | ICD-10-CM

## 2022-06-14 DIAGNOSIS — J4520 Mild intermittent asthma, uncomplicated: Secondary | ICD-10-CM

## 2022-06-14 DIAGNOSIS — J3089 Other allergic rhinitis: Secondary | ICD-10-CM

## 2022-06-14 MED ORDER — DYMISTA 137-50 MCG/ACT NA SUSP: NASAL | 5 refills | Status: AC

## 2022-06-14 MED ORDER — VENTOLIN HFA 108 (90 BASE) MCG/ACT IN AERS: 2.0000 | INHALATION_SPRAY | RESPIRATORY_TRACT | 1 refills | Status: AC | PRN

## 2022-06-14 MED ORDER — LEVOCETIRIZINE DIHYDROCHLORIDE 5 MG PO TABS: 5.0000 mg | ORAL_TABLET | Freq: Every evening | ORAL | 5 refills | Status: AC

## 2022-06-14 NOTE — Progress Notes (Signed)
New Patient Note  RE: Maria Serrano MRN: [id number] DOB: 10-Jul-1995 Date of Office Visit: 06/14/2022  Primary care provider: Armanda Heritage, NP  Chief Complaint: allergies  History of present illness: Maria Serrano is a 27 y.o. female presenting today for evaluation of allergic rhinitis.   She reports nasal congestion, frequent headaches (sees a neurologist and on migraine medication), cough, runny nose, sneezing, throat clearing.  Symptoms are year-round but worse in spring.  She states she has had issues with allergies since childhood.  She does seem to have more symptoms around family member's dog. She has tried zyrtec and claritin in the past and help some and does feel zytec works better.   She states her pcp told her she has nasal polyps and prescribed a nasal spray to use but she is not sure the name of this spray.  She states it might have been fluticasone. She has not been to ENT.   She has history of childhood asthma that she states seemed to resolve as she aged. She is active in [place] and they do have to run and may need to stop to catch her breath and may have coughing.  Will stop activity and breathing normalizes after some minutes and she can resume the activity.  No history of eczema or food allergy.     Review of systems: Review of Systems  Constitutional: Negative.   HENT:         See HPI  Eyes: Negative.   Respiratory:         See HPI  Cardiovascular: Negative.   Gastrointestinal: Negative.   Musculoskeletal: Negative.   Skin: Negative.   Allergic/Immunologic: Negative.   Neurological:        See HPI    All other systems negative unless noted above in HPI  Past medical history: Past Medical History:  Diagnosis Date   Anxiety and depression    Asthma    childhood   Migraine     Past surgical history: Past Surgical History:  Procedure Laterality Date   CESAREAN SECTION N/A 09/10/2017   Procedure: CESAREAN SECTION;  Surgeon: Florian Buff, MD;  Location: [place];  Service: Obstetrics;  Laterality: N/A;   DILATION AND CURETTAGE OF UTERUS  06/2018    Family history:  Family History  Problem Relation Age of Onset   Sinusitis Mother    Allergic rhinitis Father    Hypertension Father    Factor V Leiden deficiency Father    Endometriosis Maternal Aunt    COPD Maternal Grandfather    Cancer Maternal Grandfather        lung   Club foot Son     Social history: Lives in a home with carpeting in the bedroom with gas heating and central cooling.  No pets in the home.  Dogs in the neighborhood.  There is no concern for water damage, mildew or roaches in the home.  She does not report an occupation at this time.  She reports a smoking history from 2010-2013 of 1/2 pack cigarettes per day   Medication List: Current Outpatient Medications  Medication Sig Dispense Refill   ALPRAZolam (NIRAVAM) 0.5 MG dissolvable tablet Take 0.5 mg by mouth 3 (three) times daily as needed for anxiety.     DYMISTA 137-50 MCG/ACT SUSP 1 spray per nostril 2 times daily as needed for congestion and drainage. 23 g 5   levocetirizine (XYZAL) 5 MG tablet Take 1 tablet (5 mg total) by  mouth every evening. 30 tablet 5   sertraline (ZOLOFT) 25 MG tablet Take 25 mg by mouth daily.     SUMAtriptan (IMITREX) 50 MG tablet Take 50 mg by mouth daily as needed for migraine. May repeat in 2 hours if headache persists or recurs.     VENTOLIN HFA 108 (90 Base) MCG/ACT inhaler Inhale 2 puffs into the lungs every 4 (four) hours as needed for wheezing or shortness of breath. 18 g 1   No current facility-administered medications for this visit.    Known medication allergies: Allergies  Allergen Reactions   Amoxicillin Other (See Comments)    Childhood allergy.     Physical examination: Blood pressure 110/72, pulse 69, temperature 98.2 F (36.8 C), temperature source Temporal, resp. rate 18, height 5\' 5"  (1.651 m), weight 232 lb 14.4 oz (105.6  kg), SpO2 96 %.  General: Alert, interactive, in no acute distress. HEENT: PERRLA, TMs pearly gray, turbinates moderately edematous without discharge, no visible nasal polyps, post-pharynx non erythematous. Neck: Supple without lymphadenopathy. Lungs: Clear to auscultation without wheezing, rhonchi or rales. {no increased work of breathing. CV: Normal S1, S2 without murmurs. Abdomen: Nondistended, nontender. Skin: Warm and dry, without lesions or rashes. Extremities:  No clubbing, cyanosis or edema. Neuro:   Grossly intact.  Diagnositics/Labs:  Spirometry: FEV1: 2.78L 83%, FVC: 3.25L 82%, ratio consistent with nonobstructive pattern  Allergy testing:   Airborne Adult Perc - 06/14/22 0900     Time Antigen Placed [id number]    Allergen Manufacturer Lavella Hammock    Location Back    Number of Test 59    1. Control-Buffer 50% Glycerol Negative    2. Control-Histamine 1 mg/ml 2+    3. Albumin saline Negative    4. Country Club Negative    5. [place] Negative    6. Johnson Negative    7. Edgewood Blue Negative    8. Meadow Fescue Negative    9. Perennial Rye Negative    10. Sweet Vernal Negative    11. Timothy Negative    12. Cocklebur Negative    13. Burweed Marshelder Negative    14. Ragweed, short Negative    15. Ragweed, Giant Negative    16. Plantain,  English Negative    17. Lamb's Quarters Negative    18. Sheep Sorrell Negative    19. Rough Pigweed Negative    20. Marsh Elder, Rough Negative    21. Mugwort, Common Negative    22. Ash mix Negative    23. Birch mix Negative    24. Beech American Negative    25. Box, Elder Negative    26. Cedar, red Negative    27. Cottonwood, [place] Negative    28. Elm mix Negative    29. Hickory Negative    30. Maple mix Negative    31. Oak, [place] mix Negative    32. Pecan Pollen Negative    33. Pine mix Negative    34. Sycamore Eastern Negative    35. Cl[place], Black Pollen Negative    36. Alternaria alternata Negative    37. Cladosporium  Herbarum Negative    38. Aspergillus mix Negative    39. Penicillium mix Negative    40. Bipolaris sorokiniana (Helminthosporium) Negative    41. Drechslera spicifera (Curvularia) Negative    42. Mucor plumbeus 2+    43. Fusarium moniliforme Negative    44. Aureobasidium pullulans (pullulara) Negative    45. Rhizopus oryzae Negative    46. Botrytis cinera Negative  47. Epicoccum nigrum Negative    48. Phoma betae Negative    49. Candida Albicans Negative    50. Trichophyton mentagrophytes Negative    51. Mite, D Farinae  5,000 AU/ml Negative    52. Mite, D Pteronyssinus  5,000 AU/ml 2+    53. Cat Hair 10,000 BAU/ml Negative    54.  Dog Epithelia 2+    55. Mixed Feathers 2+    56. Horse Epithelia Negative    57. Cockroach, German Negative    58. Mouse Negative    59. Tobacco Leaf Negative    Comments w             Intradermal - 06/14/22 1000     Time Antigen Placed 1037    Allergen Manufacturer Greer    Location Arm    Number of Test 12    Control Negative    Guatemala Negative    Johnson Negative    7 Grass Negative    Ragweed mix Negative    Weed mix Negative    Tree mix Negative    Mold 1 Negative    Mold 2 2+    Mold 4 Negative    Cat Negative    Cockroach Negative             Allergy testing results were read and interpreted by provider, documented by clinical staff.   Assessment and plan: Allergic rhinitis  - Testing today showed: indoor molds, outdoor molds, dust mites, dog, and mixed feathers . - Copy of test results provided.  - Avoidance measures provided. - Start taking: Xyzal (levocetirizine) 5mg  tablet once daily.  This is a long-acting antihistamine that may be more effective than zyrtec and claritin.  Dymista (fluticasone/azelastine) 1 sprays per nostril 2 times daily as needed for nasal congestion and drainage.  Point tip of bottle toward eye on same side nostril and lean a bit forward for best technique.  - You can use an extra dose of  the antihistamine, if needed, for breakthrough symptoms.  - Consider nasal saline rinses 1-2 times daily to remove allergens from the nasal cavities as well as help with mucous clearance (this is especially helpful to do before the nasal sprays are given) - Consider allergy shots as a means of long-term control. - Allergy shots "re-train" and "reset" the immune system to ignore environmental allergens and decrease the resulting immune response to those allergens (sneezing, itchy watery eyes, runny nose, nasal congestion, etc).    - Allergy shots improve symptoms in 75-85% of patients.  - We can discuss more at a future appointment if the medications are not working for you.  Exercise-induced bronchospasm - Have access to albuterol inhaler 2 puffs every 4-6 hours as needed for cough/wheeze/shortness of breath/chest tightness.  May use 15-20 minutes prior to activity.   Monitor frequency of use. - Lung function testing today is normal!  Breathing control goals:  Full participation in all desired activities (may need albuterol before activity) Albuterol use two time or less a week on average (not counting use with activity) Cough interfering with sleep two time or less a month Oral steroids no more than once a year No hospitalizations    Follow-up in 4-6 months or sooner if needed  I appreciate the opportunity to take part in [place]'s care. Please do not hesitate to contact me with questions.  Sincerely,   Prudy Feeler, MD Allergy/Immunology [facility]

## 2022-06-14 NOTE — Patient Instructions (Signed)
-   Testing today showed: indoor molds, outdoor molds, dust mites, dog, and mixed feathers . - Copy of test results provided.  - Avoidance measures provided. - Start taking: Xyzal (levocetirizine) 5mg  tablet once daily.  This is a long-acting antihistamine that may be more effective than zyrtec and claritin.  Dymista (fluticasone/azelastine) 1 sprays per nostril 2 times daily as needed for nasal congestion and drainage.  Point tip of bottle toward eye on same side nostril and lean a bit forward for best technique.  - You can use an extra dose of the antihistamine, if needed, for breakthrough symptoms.  - Consider nasal saline rinses 1-2 times daily to remove allergens from the nasal cavities as well as help with mucous clearance (this is especially helpful to do before the nasal sprays are given) - Consider allergy shots as a means of long-term control. - Allergy shots "re-train" and "reset" the immune system to ignore environmental allergens and decrease the resulting immune response to those allergens (sneezing, itchy watery eyes, runny nose, nasal congestion, etc).    - Allergy shots improve symptoms in 75-85% of patients.  - We can discuss more at a future appointment if the medications are not working for you.  - Have access to albuterol inhaler 2 puffs every 4-6 hours as needed for cough/wheeze/shortness of breath/chest tightness.  May use 15-20 minutes prior to activity.   Monitor frequency of use. - Lung function testing today is normal!  Breathing control goals:  Full participation in all desired activities (may need albuterol before activity) Albuterol use two time or less a week on average (not counting use with activity) Cough interfering with sleep two time or less a month Oral steroids no more than once a year No hospitalizations    Follow-up in 4-6 months or sooner if needed

## 2022-06-14 NOTE — Telephone Encounter (Signed)
Hello,    I just wanted to make sure I can take xyzal because I'm also on Zoloft. I take it every night and that makes me drowsy.      Patient message office about allergy medication use please advise on dosage. I attached message above thank you.

## 2022-06-17 DIAGNOSIS — F900 Attention-deficit hyperactivity disorder, predominantly inattentive type: Secondary | ICD-10-CM | POA: Diagnosis not present

## 2022-06-24 DIAGNOSIS — F419 Anxiety disorder, unspecified: Secondary | ICD-10-CM | POA: Diagnosis not present

## 2022-06-24 DIAGNOSIS — F321 Major depressive disorder, single episode, moderate: Secondary | ICD-10-CM | POA: Diagnosis not present

## 2022-06-28 DIAGNOSIS — Z0000 Encounter for general adult medical examination without abnormal findings: Secondary | ICD-10-CM | POA: Diagnosis not present

## 2022-06-28 DIAGNOSIS — K219 Gastro-esophageal reflux disease without esophagitis: Secondary | ICD-10-CM | POA: Diagnosis not present

## 2022-07-03 DIAGNOSIS — F900 Attention-deficit hyperactivity disorder, predominantly inattentive type: Secondary | ICD-10-CM | POA: Diagnosis not present

## 2022-07-17 DIAGNOSIS — F900 Attention-deficit hyperactivity disorder, predominantly inattentive type: Secondary | ICD-10-CM | POA: Diagnosis not present

## 2022-08-02 DIAGNOSIS — F900 Attention-deficit hyperactivity disorder, predominantly inattentive type: Secondary | ICD-10-CM | POA: Diagnosis not present

## 2022-11-14 ENCOUNTER — Ambulatory Visit: Payer: Medicaid Other | Admitting: Allergy

## 2022-12-24 DIAGNOSIS — J029 Acute pharyngitis, unspecified: Secondary | ICD-10-CM | POA: Diagnosis not present

## 2022-12-24 DIAGNOSIS — K219 Gastro-esophageal reflux disease without esophagitis: Secondary | ICD-10-CM | POA: Diagnosis not present

## 2022-12-24 DIAGNOSIS — E785 Hyperlipidemia, unspecified: Secondary | ICD-10-CM | POA: Diagnosis not present

## 2022-12-24 DIAGNOSIS — R7401 Elevation of levels of liver transaminase levels: Secondary | ICD-10-CM | POA: Diagnosis not present

## 2023-01-11 DIAGNOSIS — J4521 Mild intermittent asthma with (acute) exacerbation: Secondary | ICD-10-CM | POA: Diagnosis not present

## 2023-01-14 DIAGNOSIS — M542 Cervicalgia: Secondary | ICD-10-CM | POA: Diagnosis not present

## 2023-02-11 DIAGNOSIS — M542 Cervicalgia: Secondary | ICD-10-CM | POA: Diagnosis not present

## 2023-02-12 DIAGNOSIS — G43709 Chronic migraine without aura, not intractable, without status migrainosus: Secondary | ICD-10-CM | POA: Diagnosis not present

## 2023-03-11 DIAGNOSIS — R5383 Other fatigue: Secondary | ICD-10-CM | POA: Diagnosis not present

## 2023-03-11 DIAGNOSIS — R61 Generalized hyperhidrosis: Secondary | ICD-10-CM | POA: Diagnosis not present

## 2023-04-10 DIAGNOSIS — R1013 Epigastric pain: Secondary | ICD-10-CM | POA: Diagnosis not present

## 2023-04-10 DIAGNOSIS — R109 Unspecified abdominal pain: Secondary | ICD-10-CM | POA: Diagnosis not present

## 2023-04-10 DIAGNOSIS — R140 Abdominal distension (gaseous): Secondary | ICD-10-CM | POA: Diagnosis not present

## 2023-04-10 DIAGNOSIS — R09A2 Foreign body sensation, throat: Secondary | ICD-10-CM | POA: Diagnosis not present

## 2023-04-10 DIAGNOSIS — K219 Gastro-esophageal reflux disease without esophagitis: Secondary | ICD-10-CM | POA: Diagnosis not present

## 2023-04-21 DIAGNOSIS — D7589 Other specified diseases of blood and blood-forming organs: Secondary | ICD-10-CM | POA: Diagnosis not present

## 2023-04-21 DIAGNOSIS — D751 Secondary polycythemia: Secondary | ICD-10-CM | POA: Diagnosis not present

## 2023-04-21 DIAGNOSIS — Z832 Family history of diseases of the blood and blood-forming organs and certain disorders involving the immune mechanism: Secondary | ICD-10-CM | POA: Diagnosis not present

## 2023-06-25 DIAGNOSIS — D751 Secondary polycythemia: Secondary | ICD-10-CM | POA: Diagnosis not present

## 2023-06-25 DIAGNOSIS — D7589 Other specified diseases of blood and blood-forming organs: Secondary | ICD-10-CM | POA: Diagnosis not present

## 2023-06-25 DIAGNOSIS — E538 Deficiency of other specified B group vitamins: Secondary | ICD-10-CM | POA: Diagnosis not present

## 2023-06-25 DIAGNOSIS — R5383 Other fatigue: Secondary | ICD-10-CM | POA: Diagnosis not present

## 2023-08-06 DIAGNOSIS — N62 Hypertrophy of breast: Secondary | ICD-10-CM | POA: Diagnosis not present

## 2023-10-31 ENCOUNTER — Other Ambulatory Visit: Payer: Self-pay | Admitting: Medical Genetics

## 2023-11-01 ENCOUNTER — Other Ambulatory Visit
Admission: RE | Admit: 2023-11-01 | Discharge: 2023-11-01 | Disposition: A | Discharge status: Home / Self Care | Payer: Self-pay | Source: Ambulatory Visit | Attending: Medical Genetics | Admitting: Medical Genetics

## 2023-11-03 ENCOUNTER — Other Ambulatory Visit

## 2023-11-11 LAB — GENECONNECT MOLECULAR SCREEN: Genetic Analysis Overall Interpretation: NEGATIVE
